# Patient Record
Sex: Male | Born: 1955 | Race: White | Hispanic: No | Marital: Single | Smoking: Former smoker
Health system: Southern US, Community
[De-identification: ages and names within clinical notes are randomized; demographics above are authoritative.]

## PROBLEM LIST (undated history)

## (undated) DIAGNOSIS — I10 Essential (primary) hypertension: Secondary | ICD-10-CM

## (undated) HISTORY — PX: OTHER SURGICAL HISTORY: SHX169

## (undated) HISTORY — PX: KNEE SURGERY: SHX244

## (undated) HISTORY — DX: Essential (primary) hypertension: I10

## (undated) HISTORY — PX: WISDOM TOOTH EXTRACTION: SHX21

---

## 2015-09-18 DIAGNOSIS — I10 Essential (primary) hypertension: Secondary | ICD-10-CM | POA: Insufficient documentation

## 2015-09-18 DIAGNOSIS — R5383 Other fatigue: Secondary | ICD-10-CM | POA: Insufficient documentation

## 2015-09-18 DIAGNOSIS — Z79899 Other long term (current) drug therapy: Secondary | ICD-10-CM | POA: Insufficient documentation

## 2015-09-22 DIAGNOSIS — E782 Mixed hyperlipidemia: Secondary | ICD-10-CM | POA: Insufficient documentation

## 2015-09-22 DIAGNOSIS — R7309 Other abnormal glucose: Secondary | ICD-10-CM | POA: Diagnosis not present

## 2015-09-22 DIAGNOSIS — G5603 Carpal tunnel syndrome, bilateral upper limbs: Secondary | ICD-10-CM

## 2015-09-22 DIAGNOSIS — I10 Essential (primary) hypertension: Secondary | ICD-10-CM | POA: Diagnosis not present

## 2015-09-22 DIAGNOSIS — E1169 Type 2 diabetes mellitus with other specified complication: Secondary | ICD-10-CM | POA: Insufficient documentation

## 2015-09-22 DIAGNOSIS — M159 Polyosteoarthritis, unspecified: Secondary | ICD-10-CM | POA: Insufficient documentation

## 2015-09-22 DIAGNOSIS — R5383 Other fatigue: Secondary | ICD-10-CM | POA: Diagnosis not present

## 2015-09-22 DIAGNOSIS — Z79899 Other long term (current) drug therapy: Secondary | ICD-10-CM | POA: Diagnosis not present

## 2015-09-22 HISTORY — DX: Carpal tunnel syndrome, bilateral upper limbs: G56.03

## 2015-09-22 HISTORY — DX: Mixed hyperlipidemia: E78.2

## 2015-09-25 DIAGNOSIS — E782 Mixed hyperlipidemia: Secondary | ICD-10-CM | POA: Diagnosis not present

## 2015-09-25 DIAGNOSIS — Z79899 Other long term (current) drug therapy: Secondary | ICD-10-CM | POA: Diagnosis not present

## 2015-09-25 DIAGNOSIS — M159 Polyosteoarthritis, unspecified: Secondary | ICD-10-CM | POA: Diagnosis not present

## 2017-03-23 DIAGNOSIS — M25562 Pain in left knee: Secondary | ICD-10-CM | POA: Diagnosis not present

## 2017-03-23 DIAGNOSIS — I10 Essential (primary) hypertension: Secondary | ICD-10-CM | POA: Diagnosis not present

## 2017-03-31 DIAGNOSIS — Z1211 Encounter for screening for malignant neoplasm of colon: Secondary | ICD-10-CM | POA: Diagnosis not present

## 2017-03-31 DIAGNOSIS — Z0000 Encounter for general adult medical examination without abnormal findings: Secondary | ICD-10-CM | POA: Diagnosis not present

## 2017-03-31 DIAGNOSIS — Z6833 Body mass index (BMI) 33.0-33.9, adult: Secondary | ICD-10-CM | POA: Diagnosis not present

## 2017-03-31 DIAGNOSIS — Z125 Encounter for screening for malignant neoplasm of prostate: Secondary | ICD-10-CM | POA: Diagnosis not present

## 2017-05-06 ENCOUNTER — Ambulatory Visit (INDEPENDENT_AMBULATORY_CARE_PROVIDER_SITE_OTHER): Payer: Self-pay

## 2017-05-06 ENCOUNTER — Encounter (INDEPENDENT_AMBULATORY_CARE_PROVIDER_SITE_OTHER): Payer: Self-pay | Admitting: Orthopaedic Surgery

## 2017-05-06 ENCOUNTER — Ambulatory Visit (INDEPENDENT_AMBULATORY_CARE_PROVIDER_SITE_OTHER): Payer: BLUE CROSS/BLUE SHIELD | Admitting: Orthopaedic Surgery

## 2017-05-06 VITALS — BP 179/72 | HR 73 | Resp 14 | Ht 69.0 in | Wt 200.0 lb

## 2017-05-06 DIAGNOSIS — G8929 Other chronic pain: Secondary | ICD-10-CM | POA: Diagnosis not present

## 2017-05-06 DIAGNOSIS — M25562 Pain in left knee: Secondary | ICD-10-CM

## 2017-05-06 MED ORDER — BUPIVACAINE HCL 0.5 % IJ SOLN
2.0000 mL | INTRAMUSCULAR | Status: AC | PRN
  Administered 2017-05-06: 2 mL via INTRA_ARTICULAR

## 2017-05-06 MED ORDER — METHYLPREDNISOLONE ACETATE 40 MG/ML IJ SUSP
80.0000 mg | INTRAMUSCULAR | Status: AC | PRN
  Administered 2017-05-06: 80 mg

## 2017-05-06 MED ORDER — LIDOCAINE HCL 1 % IJ SOLN
2.0000 mL | INTRAMUSCULAR | Status: AC | PRN
  Administered 2017-05-06: 2 mL

## 2017-05-06 NOTE — Progress Notes (Signed)
Office Visit Note   Patient: Maxwell Williams           Date of Birth: February 25, 1956           MRN: [id number] Visit Date: 05/06/2017              Requested by: Gerre Pebbles, PA-C [address] [place], [place] [address] PCP: Blane Ohara, MD   Assessment & Plan: Visit Diagnoses:  1. Chronic pain of left knee     Plan: Insidious onset of left knee pain over the last several months. Suspect problem is related to mild tricompartmental degenerative arthritis. We'll aspirate the knee and injected cortisone. Monitor her response over the next month. We'll try NSAIDs and a pullover knee support  Follow-Up Instructions: Return in about 1 month (around 06/06/2017), or if still symptomatic.   Orders:  Orders Placed This Encounter  Procedures  . Large Joint Inj: L knee  . XR KNEE 3 VIEW LEFT   No orders of the defined types were placed in this encounter.     Procedures: Large Joint Inj: L knee on 05/06/2017 9:14 AM Indications: pain and diagnostic evaluation Details: 25 G 1.5 in needle, anteromedial approach  Arthrogram: No  Medications: 2 mL lidocaine 1 %; 2 mL bupivacaine 0.5 %; 80 mg methylPREDNISolone acetate 40 MG/ML Aspirate: 40 mL clear and yellow Procedure, treatment alternatives, risks and benefits explained, specific risks discussed. Consent was given by the patient. Patient was prepped and draped in the usual sterile fashion.       Clinical Data: No additional findings.   Subjective: Chief Complaint  Patient presents with  . Left Knee - Pain    Maxwell Williams is a 62 y o here today for Left knee pain x 2 months. He was working in a crawl space and twisted it.   Insidious onset of left knee pain over the last several months. It seemed to have initiated while working in a crawl space. No obvious injury or trauma. Has experienced recurrent effusion and stiffness associated with pain along the medial compartment. The pain seems to be slightly worse now than it was a  month ago thus prompting the office visit. No fever or chills. No skin changes. No locking.  HPI  Review of Systems  Constitutional: Negative for fatigue.  HENT: Positive for tinnitus. Negative for hearing loss.   Respiratory: Negative for apnea, chest tightness and shortness of breath.   Cardiovascular: Negative for chest pain, palpitations and leg swelling.  Gastrointestinal: Negative for blood in stool, constipation and diarrhea.  Genitourinary: Negative for difficulty urinating.  Musculoskeletal: Positive for back pain. Negative for arthralgias, joint swelling, myalgias, neck pain and neck stiffness.  Neurological: Positive for weakness. Negative for numbness and headaches.  Hematological: Does not bruise/bleed easily.  Psychiatric/Behavioral: Negative for sleep disturbance. The patient is not nervous/anxious.      Objective: Vital Signs: BP (!) 179/72   Pulse 73   Resp 14   Ht 5\' 9"  (1.753 m)   Wt 200 lb (90.7 kg)   BMI 29.53 kg/m   Physical Exam  Ortho Exam awake alert and oriented 3. Comfortable sitting. Fairly large effusion left knee. Predominantly medial joint pain that was mild to moderate. No lateral joint pain. No patella crepitation. Full extension. Flexed over 110 without instability. No popliteal pain. No calf pain. No distal edema. Neurovascular exam intact. Straight leg raise negative. Painless range of motion both hips with internal/external rotation  Specialty Comments:  No specialty comments available.  Imaging: Xr Knee 3 View Left  Result Date: 05/06/2017 Films of the left knee are obtained in several projections standing. There are mild tricompartmental degenerative changes. Predominantly noted changes and at the patellofemoral joint where there is just minimal lateral patella tilt and peripheral osteophyte formation and some narrowing of the medial joint space. No ectopic calcification. About 1 of varus. Findings are consistent with mild to moderate  osteoarthritis    PMFS History: There are no active problems to display for this patient.  Past Medical History:  Diagnosis Date  . Hypertension     Family History  Problem Relation Age of Onset  . COPD Mother     Past Surgical History:  Procedure Laterality Date  . WISDOM TOOTH EXTRACTION    . wisom     Social History   Occupational History  . Not on file  Tobacco Use  . Smoking status: Former Games developermoker  . Smokeless tobacco: Former Engineer, waterUser  Substance and Sexual Activity  . Alcohol use: No    Frequency: Never  . Drug use: No  . Sexual activity: Not on file

## 2017-05-23 ENCOUNTER — Telehealth (INDEPENDENT_AMBULATORY_CARE_PROVIDER_SITE_OTHER): Payer: Self-pay | Admitting: Orthopaedic Surgery

## 2017-05-23 ENCOUNTER — Other Ambulatory Visit (INDEPENDENT_AMBULATORY_CARE_PROVIDER_SITE_OTHER): Payer: Self-pay | Admitting: Radiology

## 2017-05-23 DIAGNOSIS — M25562 Pain in left knee: Secondary | ICD-10-CM

## 2017-05-23 NOTE — Telephone Encounter (Signed)
Please advise 

## 2017-05-23 NOTE — Telephone Encounter (Signed)
[facility]

## 2017-05-23 NOTE — Progress Notes (Signed)
[facility]

## 2017-05-23 NOTE — Telephone Encounter (Signed)
Agree-MRI left knee

## 2017-05-23 NOTE — Telephone Encounter (Signed)
Patient called stating that he had a cortisone shot and fluid removed from his left knee at his office visit on 3/1 which gave him relief for approximately 3-4 days.  The pain has returned and he is requesting a referral to get an MRI.

## 2017-06-03 ENCOUNTER — Ambulatory Visit
Admission: RE | Admit: 2017-06-03 | Discharge: 2017-06-03 | Disposition: A | Discharge status: Home / Self Care | Payer: BLUE CROSS/BLUE SHIELD | Source: Ambulatory Visit | Attending: Orthopaedic Surgery | Admitting: Orthopaedic Surgery

## 2017-06-03 ENCOUNTER — Other Ambulatory Visit: Payer: Self-pay

## 2017-06-03 DIAGNOSIS — M25562 Pain in left knee: Secondary | ICD-10-CM | POA: Diagnosis not present

## 2017-06-07 ENCOUNTER — Ambulatory Visit (INDEPENDENT_AMBULATORY_CARE_PROVIDER_SITE_OTHER): Payer: BLUE CROSS/BLUE SHIELD | Admitting: Orthopaedic Surgery

## 2017-06-10 ENCOUNTER — Ambulatory Visit (INDEPENDENT_AMBULATORY_CARE_PROVIDER_SITE_OTHER): Payer: BLUE CROSS/BLUE SHIELD | Admitting: Orthopaedic Surgery

## 2017-06-10 ENCOUNTER — Encounter (INDEPENDENT_AMBULATORY_CARE_PROVIDER_SITE_OTHER): Payer: Self-pay | Admitting: Orthopaedic Surgery

## 2017-06-10 VITALS — BP 160/74 | HR 72 | Resp 16 | Ht 69.5 in | Wt 212.0 lb

## 2017-06-10 DIAGNOSIS — M25562 Pain in left knee: Secondary | ICD-10-CM | POA: Diagnosis not present

## 2017-06-10 DIAGNOSIS — G8929 Other chronic pain: Secondary | ICD-10-CM

## 2017-06-10 NOTE — Progress Notes (Signed)
Office Visit Note   Patient: Maxwell CahillMark A Digirolamo           Date of Birth: 10/07/1955           MRN: [id number] Visit Date: 06/10/2017              Requested by: Blane Oharaox, Kirsten, MD [address] [place], [place] [id number] PCP: Blane Oharaox, Kirsten, MD   Assessment & Plan: Visit Diagnoses:  1. 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  Also has tears of both medial lateral menisci.  Long discussion regarding the MRI scan findings.  I would suggest arthroscopy.  This would be to address mechanical issues related to the arthritis and both meniscal tears.  I discussed the time out of work and the potential for pain relief.  Discussed potential complications.  Works up to 8 or 9 hours a day on his feet and it might be weeks before he can return to that activity.  He certainly could have some residual pain because of his arthritis.  He is fully aware that.  [place] he certainly has notations in terms of the arthritis which I have discussed to proceed .we will set this up at his nodule.  Follow-Up Instructions: Return will schedule surgery.   Orders:  No orders of the defined types were placed in this encounter.  No orders of the defined types were placed in this encounter.     Procedures: No procedures performed   Clinical Data: No additional findings.   Subjective: Chief Complaint  Patient presents with  . Follow-up    pain and numbness down right leg, review MRI  Initial onset of pain December 2018 in his crawl space fixing some duct work.  Today at work on his feet and has had a lot of difficulty with pain aching swelling as well as stiffness.  X-rays have revealed some degenerative changes in his knees.  I ordered an MRI scan which demonstrates a tear of the posterior horn of the medial lateral menisci near the roots.  He also has varying degrees of  arthritis in all 3 compartments with some areas of full-thickness cartilage loss.  HPI  Review of Systems  Constitutional: Negative for fatigue.  HENT: Negative for ear pain.   Eyes: Negative for pain.  Respiratory: Negative for cough and shortness of breath.   Gastrointestinal: Negative for constipation and diarrhea.  Genitourinary: Negative for difficulty urinating.  Musculoskeletal: Positive for back pain. Negative for neck pain.  Skin: Negative for rash.  Allergic/Immunologic: Negative for food allergies.  Neurological: Positive for weakness. Negative for dizziness, numbness and headaches.  Hematological: Does not bruise/bleed easily.  Psychiatric/Behavioral: Negative for sleep disturbance.     Objective: Vital Signs: BP (!) 160/74 (BP Location: Left Arm, Patient Position: Sitting, Cuff Size: Normal)   Pulse 72   Resp 16   Ht 5' 9.5" (1.765 m)   Wt 212 lb (96.2 kg)   BMI 30.86 kg/m   Physical Exam  Ortho Exam awake alert and oriented x3.  Comfortable sitting.  Small effusion left knee.  Does have medial lateral joint pain.  Some lack of full extension which is probably the small effusion.  Flexed over 100 degrees.  No instability.  No calf pain.  No distal edema.  No thigh or hip pain.  Neurovascular exam intact.  Specialty Comments:  No  specialty comments available.  Imaging: No results found.   PMFS History: There are no active problems to display for this patient.  Past Medical History:  Diagnosis Date  . Hypertension     Family History  Problem Relation Age of Onset  . COPD Mother     Past Surgical History:  Procedure Laterality Date  . WISDOM TOOTH EXTRACTION    . wisom     Social History   Occupational History  . Not on file  Tobacco Use  . Smoking status: Former Games developer  . Smokeless tobacco: Former Engineer, water and Sexual Activity  . Alcohol use: No    Frequency: Never  . Drug use: No  . Sexual activity: Not on file

## 2017-06-14 ENCOUNTER — Inpatient Hospital Stay (INDEPENDENT_AMBULATORY_CARE_PROVIDER_SITE_OTHER): Payer: BLUE CROSS/BLUE SHIELD | Admitting: Orthopaedic Surgery

## 2017-06-16 DIAGNOSIS — M659 Synovitis and tenosynovitis, unspecified: Secondary | ICD-10-CM | POA: Diagnosis not present

## 2017-06-16 DIAGNOSIS — M23252 Derangement of posterior horn of lateral meniscus due to old tear or injury, left knee: Secondary | ICD-10-CM | POA: Diagnosis not present

## 2017-06-16 DIAGNOSIS — M94262 Chondromalacia, left knee: Secondary | ICD-10-CM | POA: Diagnosis not present

## 2017-06-16 DIAGNOSIS — M1712 Unilateral primary osteoarthritis, left knee: Secondary | ICD-10-CM | POA: Diagnosis not present

## 2017-06-16 DIAGNOSIS — G8918 Other acute postprocedural pain: Secondary | ICD-10-CM | POA: Diagnosis not present

## 2017-06-16 DIAGNOSIS — S83281A Other tear of lateral meniscus, current injury, right knee, initial encounter: Secondary | ICD-10-CM | POA: Diagnosis not present

## 2017-06-16 DIAGNOSIS — M23222 Derangement of posterior horn of medial meniscus due to old tear or injury, left knee: Secondary | ICD-10-CM | POA: Diagnosis not present

## 2017-06-16 DIAGNOSIS — S83241A Other tear of medial meniscus, current injury, right knee, initial encounter: Secondary | ICD-10-CM | POA: Diagnosis not present

## 2017-06-20 ENCOUNTER — Encounter (INDEPENDENT_AMBULATORY_CARE_PROVIDER_SITE_OTHER): Payer: Self-pay | Admitting: Orthopaedic Surgery

## 2017-06-20 ENCOUNTER — Ambulatory Visit (INDEPENDENT_AMBULATORY_CARE_PROVIDER_SITE_OTHER): Payer: BLUE CROSS/BLUE SHIELD | Admitting: Orthopaedic Surgery

## 2017-06-20 VITALS — BP 147/131 | HR 80 | Resp 16 | Ht 69.0 in | Wt 215.0 lb

## 2017-06-20 DIAGNOSIS — M25562 Pain in left knee: Secondary | ICD-10-CM

## 2017-06-20 DIAGNOSIS — G8929 Other chronic pain: Secondary | ICD-10-CM

## 2017-06-20 NOTE — Progress Notes (Signed)
Office Visit Note   Patient: Maxwell CahillMark A Williams           Date of Birth: 07/18/1955           MRN: [id number] Visit Date: 06/20/2017              Requested by: Blane Oharaox, Kirsten, MD [address] [place], [place] [id number] PCP: Blane Oharaox, Kirsten, MD   Assessment & Plan: Visit Diagnoses:  1. Chronic pain of left knee     Plan: 4 days status post left knee arthroscopy.  Had partial medial and lateral meniscectomy.  Also had grade II and III chondromalacia in all 3 compartments.  Doing well.  No longer using cane or crutch.  Takes oxycodone 2-3 times a day.  Would like to return to work on Thursday comfortable.  We will give him a note to that effect and see him in 7-10 days  Follow-Up Instructions: Return in about 1 week (around 06/27/2017).   Orders:  No orders of the defined types were placed in this encounter.  No orders of the defined types were placed in this encounter.     Procedures: No procedures performed   Clinical Data: No additional findings.   Subjective: Chief Complaint  Patient presents with  . Left Knee - Routine Post Op  . Post-op Follow-up    l knee f/u post op 06/16/17, no issues  4 days status post left knee arthroscopy.  Had tears of medial lateral menisci with partial meniscectomies.  Also had chondromalacia in all 3 compartments.  Doing well.  No fever or chills.  No longer using ambulatory aid.  Takes an occasional oxycodone.  HPI  Review of Systems  Constitutional: Negative for fatigue and fever.  HENT: Negative for ear pain.   Eyes: Negative for pain.  Respiratory: Negative for cough and shortness of breath.   Cardiovascular: Negative for leg swelling.  Gastrointestinal: Negative for constipation and diarrhea.  Genitourinary: Negative for difficulty urinating.  Musculoskeletal: Negative for back pain and neck pain.  Skin: Negative for rash.  Allergic/Immunologic: Negative for food allergies.  Neurological: Positive for weakness. Negative for  numbness.  Hematological: Does not bruise/bleed easily.  Psychiatric/Behavioral: Negative for sleep disturbance.     Objective: Vital Signs: BP (!) 147/131 (BP Location: Left Arm, Patient Position: Sitting, Cuff Size: Normal)   Pulse 80   Resp 16   Ht 5\' 9"  (1.753 m)   Wt 215 lb (97.5 kg)   BMI 31.75 kg/m   Physical Exam  Ortho Exam Awake alert and oriented x3.  Comfortable sitting.  Lacks a few degrees to full extension left knee but has a small effusion.  Flexed at least 105 degrees.  Arthroscopic portals clean.  No drainage.  No calf pain.  Neurovascular exam intact Specialty Comments:  No specialty comments available.  Imaging: No results found.   PMFS History: There are no active problems to display for this patient.  Past Medical History:  Diagnosis Date  . Hypertension     Family History  Problem Relation Age of Onset  . COPD Mother     Past Surgical History:  Procedure Laterality Date  . KNEE SURGERY    . WISDOM TOOTH EXTRACTION    . wisom     Social History   Occupational History  . Not on file  Tobacco Use  . Smoking status: Former Games developermoker  . Smokeless tobacco: Former Engineer, waterUser  Substance and Sexual Activity  . Alcohol use: No  Frequency: Never  . Drug use: No  . Sexual activity: Not on file

## 2017-07-04 ENCOUNTER — Ambulatory Visit (INDEPENDENT_AMBULATORY_CARE_PROVIDER_SITE_OTHER): Payer: BLUE CROSS/BLUE SHIELD | Admitting: Orthopaedic Surgery

## 2017-07-05 ENCOUNTER — Encounter (INDEPENDENT_AMBULATORY_CARE_PROVIDER_SITE_OTHER): Payer: Self-pay | Admitting: Orthopaedic Surgery

## 2017-07-05 ENCOUNTER — Ambulatory Visit (INDEPENDENT_AMBULATORY_CARE_PROVIDER_SITE_OTHER): Payer: BLUE CROSS/BLUE SHIELD | Admitting: Orthopaedic Surgery

## 2017-07-05 VITALS — BP 127/70 | HR 80 | Resp 16 | Ht 69.5 in | Wt 210.0 lb

## 2017-07-05 DIAGNOSIS — M25562 Pain in left knee: Secondary | ICD-10-CM

## 2017-07-05 DIAGNOSIS — G8929 Other chronic pain: Secondary | ICD-10-CM

## 2017-07-05 MED ORDER — METHYLPREDNISOLONE ACETATE 40 MG/ML IJ SUSP
80.0000 mg | INTRAMUSCULAR | Status: AC | PRN
  Administered 2017-07-05: 80 mg

## 2017-07-05 MED ORDER — LIDOCAINE HCL 1 % IJ SOLN
2.0000 mL | INTRAMUSCULAR | Status: AC | PRN
  Administered 2017-07-05: 2 mL

## 2017-07-05 MED ORDER — BUPIVACAINE HCL 0.5 % IJ SOLN
2.0000 mL | INTRAMUSCULAR | Status: AC | PRN
  Administered 2017-07-05: 2 mL via INTRA_ARTICULAR

## 2017-07-05 NOTE — Progress Notes (Signed)
Office Visit Note   Patient: Maxwell Williams           Date of Birth: 02-Feb-1956           MRN: [id number] Visit Date: 07/05/2017              Requested by: Blane Ohara, MD [address] [place], [place] [address] PCP: Blane Ohara, MD   Assessment & Plan: Visit Diagnoses:  1. Chronic pain of left knee     Plan: Nearly 3 weeks status post left knee arthroscopy.  There were diffuse grade II and III chondromalacia changes as well as a tear of the medial lateral menisci.  A partial medial lateral meniscus ectomy's were performed.  He is back to work.  Having some recurrent effusion.  I aspirated his knee today of 90 cc of clear yellow fluid injected cortisone.  Felt much better.  Have talked about limiting activity and taking NSAIDs.  Return in 2 weeks  Follow-Up Instructions: Return in about 2 weeks (around 07/19/2017).   Orders:  Orders Placed This Encounter  Procedures  . Large Joint Inj: L knee   No orders of the defined types were placed in this encounter.     Procedures: Large Joint Inj: L knee on 07/05/2017 4:02 PM Indications: pain and diagnostic evaluation Details: 25 G 1.5 in needle, anteromedial approach  Arthrogram: No  Medications: 2 mL lidocaine 1 %; 2 mL bupivacaine 0.5 %; 80 mg methylPREDNISolone acetate 40 MG/ML Aspirate: 90 mL clear and yellow Outcome: tolerated well, no immediate complications Procedure, treatment alternatives, risks and benefits explained, specific risks discussed. Consent was given by the patient. Patient was prepped and draped in the usual sterile fashion.       Clinical Data: No additional findings.   Subjective: Chief Complaint  Patient presents with  . Left Knee - Follow-up  . Follow-up    L KNEE F/U PAIN WAKING FROM SLEEP, CAUSING MUSCLE PAIN ON RIGHT SIDE KNEE AND CALF MUSCLES   Experiencing left knee swelling and stiffness.  No fever chills  HPI  Review of Systems  Constitutional: Negative for fatigue and  fever.  HENT: Negative for ear pain.   Eyes: Negative for pain.  Respiratory: Negative for cough and shortness of breath.   Cardiovascular: Positive for leg swelling.  Gastrointestinal: Negative for constipation and diarrhea.  Genitourinary: Negative for difficulty urinating.  Musculoskeletal: Positive for back pain. Negative for neck pain.  Skin: Negative for rash.  Allergic/Immunologic: Negative for food allergies.  Neurological: Positive for weakness. Negative for numbness.  Hematological: Does not bruise/bleed easily.  Psychiatric/Behavioral: Positive for sleep disturbance.     Objective: Vital Signs: BP 127/70 (BP Location: Left Arm, Patient Position: Sitting, Cuff Size: Normal)   Pulse 80   Resp 16   Ht 5' 9.5" (1.765 m)   Wt 210 lb (95.3 kg)   BMI 30.57 kg/m   Physical Exam  Ortho Exam awake alert and oriented x3.  Comfortable sitting.  Left knee with positive effusion.  Minimally increased heat.  No significant tenderness.  No calf pain.  Swelling distally.  Full extension to about 115 degrees of flexion without instability. After aspiration had full extension no pain in over 125 degrees of flexion  Specialty Comments:  No specialty comments available.  Imaging: No results found.   PMFS History: There are no active problems to display for this patient.  Past Medical History:  Diagnosis Date  . Hypertension  Family History  Problem Relation Age of Onset  . COPD Mother     Past Surgical History:  Procedure Laterality Date  . KNEE SURGERY    . WISDOM TOOTH EXTRACTION    . wisom     Social History   Occupational History  . Not on file  Tobacco Use  . Smoking status: Former Games developer  . Smokeless tobacco: Former Engineer, water and Sexual Activity  . Alcohol use: No    Frequency: Never  . Drug use: No  . Sexual activity: Not on file

## 2017-07-07 ENCOUNTER — Ambulatory Visit (INDEPENDENT_AMBULATORY_CARE_PROVIDER_SITE_OTHER): Payer: BLUE CROSS/BLUE SHIELD | Admitting: Orthopaedic Surgery

## 2017-07-18 ENCOUNTER — Ambulatory Visit (INDEPENDENT_AMBULATORY_CARE_PROVIDER_SITE_OTHER): Payer: BLUE CROSS/BLUE SHIELD | Admitting: Orthopaedic Surgery

## 2017-07-25 ENCOUNTER — Ambulatory Visit (INDEPENDENT_AMBULATORY_CARE_PROVIDER_SITE_OTHER): Payer: BLUE CROSS/BLUE SHIELD | Admitting: Orthopaedic Surgery

## 2017-07-25 ENCOUNTER — Encounter (INDEPENDENT_AMBULATORY_CARE_PROVIDER_SITE_OTHER): Payer: Self-pay | Admitting: Orthopaedic Surgery

## 2017-07-25 VITALS — BP 150/80 | HR 80 | Resp 18 | Ht 69.0 in | Wt 216.0 lb

## 2017-07-25 DIAGNOSIS — M25562 Pain in left knee: Secondary | ICD-10-CM

## 2017-07-25 DIAGNOSIS — G8929 Other chronic pain: Secondary | ICD-10-CM

## 2017-07-25 NOTE — Progress Notes (Signed)
Office Visit Note   Patient: Maxwell Williams           Date of Birth: 1955/12/04           MRN: [id number] Visit Date: 07/25/2017              Requested by: Blane Ohara, MD [address] [place], [place] [address] PCP: Blane Ohara, MD   Assessment & Plan: Visit Diagnoses:  1. Chronic pain of left knee     Plan: 6 weeks status post left knee arthroscopy.  I performed a partial medial meniscectomy.  There were grade 2 and 3 areas of chondromalacia diffusely about the knee.  Maxwell Williams is been back to work and has noted improvement in his pain.  He has had some recurrent effusion.  We will start an exercise program, continue NSAIDs and see me in 6 weeks  Follow-Up Instructions: Return in about 6 weeks (around 09/05/2017).   Orders:  No orders of the defined types were placed in this encounter.  No orders of the defined types were placed in this encounter.     Procedures: No procedures performed   Clinical Data: No additional findings.   Subjective: Chief Complaint  Patient presents with  . Left Knee - Pain  . Post-op Follow-up    06/17/17 LKA  6 weeks status post left knee arthroscopy.  I saw Maxwell Williams 3 weeks ago and aspirated his knee and injected cortisone.  He is a little bit better.  He is having less pain in his knee.  He is on his feet over the course of his workday.  He has some swelling at the end of the day but it is less it was in the past.  No numbness or tingling.  Has been taking NSAIDs which help. HPI  Review of Systems  Constitutional: Negative for fatigue and fever.  HENT: Negative for ear pain.   Eyes: Negative for pain.  Respiratory: Negative for cough and shortness of breath.   Cardiovascular: Positive for leg swelling.  Gastrointestinal: Negative for constipation and diarrhea.  Genitourinary: Negative for difficulty urinating.  Musculoskeletal: Positive for back pain. Negative for neck pain.  Skin: Negative for rash.  Allergic/Immunologic:  Negative for food allergies.  Neurological: Positive for weakness and numbness.  Hematological: Does not bruise/bleed easily.  Psychiatric/Behavioral: Positive for sleep disturbance.     Objective: Vital Signs: BP (!) 150/80 (BP Location: Left Arm, Patient Position: Sitting, Cuff Size: Normal)   Pulse 80   Resp 18   Ht  (1.753 m)   Wt 216 lb (98 kg)   BMI 31.90 kg/m   Physical Exam  Ortho Exam awake alert and oriented x3.  Comfortable sitting.  Mild effusion left knee.  He can flex at least 110 degrees with full extension.  No instability.  No significant tenderness medially laterally or beneath the patella.  No swelling distally.  Neurovascular exam intact  Specialty Comments:  No specialty comments available.  Imaging: No results found.   PMFS History: There are no active problems to display for this patient.  Past Medical History:  Diagnosis Date  . Hypertension     Family History  Problem Relation Age of Onset  . COPD Mother     Past Surgical History:  Procedure Laterality Date  . KNEE SURGERY    . WISDOM TOOTH EXTRACTION    . wisom     Social History   Occupational History  . Not on file  Tobacco Use  . Smoking status: Former Games developer  . Smokeless tobacco: Former Engineer, water and Sexual Activity  . Alcohol use: No    Frequency: Never  . Drug use: No  . Sexual activity: Not on file

## 2017-09-05 ENCOUNTER — Encounter (INDEPENDENT_AMBULATORY_CARE_PROVIDER_SITE_OTHER): Payer: Self-pay | Admitting: Orthopaedic Surgery

## 2017-09-05 ENCOUNTER — Ambulatory Visit (INDEPENDENT_AMBULATORY_CARE_PROVIDER_SITE_OTHER): Payer: BLUE CROSS/BLUE SHIELD | Admitting: Orthopaedic Surgery

## 2017-09-05 VITALS — BP 173/92 | HR 69 | Ht 69.5 in | Wt 216.0 lb

## 2017-09-05 DIAGNOSIS — M25562 Pain in left knee: Secondary | ICD-10-CM

## 2017-09-05 MED ORDER — BUPIVACAINE HCL 0.5 % IJ SOLN
2.0000 mL | INTRAMUSCULAR | Status: AC | PRN
  Administered 2017-09-05: 2 mL via INTRA_ARTICULAR

## 2017-09-05 MED ORDER — METHYLPREDNISOLONE ACETATE 40 MG/ML IJ SUSP
80.0000 mg | INTRAMUSCULAR | Status: AC | PRN
  Administered 2017-09-05: 80 mg

## 2017-09-05 MED ORDER — LIDOCAINE HCL 1 % IJ SOLN
2.0000 mL | INTRAMUSCULAR | Status: AC | PRN
  Administered 2017-09-05: 2 mL

## 2017-09-05 NOTE — Progress Notes (Signed)
Office Visit Note   Patient: Maxwell CahillMark A Hermann           Date of Birth: 08/12/1955           MRN: [id number] Visit Date: 09/05/2017              Requested by: Blane Oharaox, Kirsten, MD [address] [place], [place] [id number] PCP: Blane Oharaox, Kirsten, MD   Assessment & Plan: Visit Diagnoses:  1. Acute pain of left knee     Plan: Recurrent effusion left knee nearly 3 months post left knee arthroscopy.  Had a tear of the medial lateral menisci as well as diffuse degenerative changes.  Will re-aspirate the knee and inject with cortisone.  Long discussion regarding status of knee and review of operative note  Follow-Up Instructions: Return if symptoms worsen or fail to improve.   Orders:  Orders Placed This Encounter  Procedures  . Large Joint Inj: L knee  . Anaerobic and Aerobic Culture  . Synovial cell count + diff, w/ crystals   No orders of the defined types were placed in this encounter.     Procedures: Large Joint Inj: L knee on 09/05/2017 5:04 PM Indications: pain and diagnostic evaluation Details: 25 G 1.5 in needle, anteromedial approach  Arthrogram: No  Medications: 2 mL lidocaine 1 %; 2 mL bupivacaine 0.5 %; 80 mg methylPREDNISolone acetate 40 MG/ML Aspirate: 63 mL clear and yellow Procedure, treatment alternatives, risks and benefits explained, specific risks discussed. Consent was given by the patient. Patient was prepped and draped in the usual sterile fashion.       Clinical Data: No additional findings.   Subjective: Chief Complaint  Patient presents with  . Follow-up    L KNEE PAIN SURGERY 06/16/17 L TKA F/U AND DRAINED IT 2 TIMES STILL HAVING SWELLING. ALSO HAVING LOWER MUSCLE LEG  PAIN FROM FAVORING LEFT KNEE  Nearly 3 months status post left knee arthroscopy.  Included debridement of the tear of the posterior horn of the medial meniscus and tear near the root of the lateral meniscus.  There were areas of grade II and III chondromalacia in all 3  compartments.  Has had recurrent effusion without fever or chills.  Spends at least 9 hours a day on his feet  HPI  Review of Systems  Constitutional: Negative for fatigue and fever.  HENT: Negative for ear pain.   Eyes: Negative for pain.  Respiratory: Negative for cough and shortness of breath.   Cardiovascular: Positive for leg swelling.  Gastrointestinal: Negative for constipation and diarrhea.  Genitourinary: Negative for difficulty urinating.  Musculoskeletal: Positive for back pain. Negative for neck pain.  Skin: Positive for rash.  Allergic/Immunologic: Negative for food allergies.  Neurological: Positive for weakness and numbness.  Hematological: Does not bruise/bleed easily.  Psychiatric/Behavioral: Positive for sleep disturbance.     Objective: Vital Signs: BP (!) 173/92 (BP Location: Left Arm, Patient Position: Sitting, Cuff Size: Normal)   Pulse 69   Ht 5' 9.5" (1.765 m)   Wt 216 lb (98 kg)   BMI 31.44 kg/m   Physical Exam  Ortho Exam awake alert and oriented x3.  Comfortable sitting.  Large effusion left knee with minimal warmth.  No significant pain.  Full extension flexion about 100 degrees without instability.  Some mild patellar crepitation.  After knee aspiration much better flexion extension with little if any pain.  Specialty Comments:  No specialty comments available.  Imaging: No results found.   PMFS History:  There are no active problems to display for this patient.  Past Medical History:  Diagnosis Date  . Hypertension     Family History  Problem Relation Age of Onset  . COPD Mother     Past Surgical History:  Procedure Laterality Date  . KNEE SURGERY    . WISDOM TOOTH EXTRACTION    . wisom     Social History   Occupational History  . Not on file  Tobacco Use  . Smoking status: Former Games developer  . Smokeless tobacco: Former Engineer, water and Sexual Activity  . Alcohol use: No    Frequency: Never  . Drug use: No  . Sexual  activity: Not on file

## 2017-09-11 LAB — ANAEROBIC AND AEROBIC CULTURE
AER RESULT:: NO GROWTH
MICRO NUMBER: 90784684
MICRO NUMBER:: 90784587
SPECIMEN QUALITY:: ADEQUATE
SPECIMEN QUALITY:: ADEQUATE

## 2017-09-11 LAB — SYNOVIAL CELL COUNT + DIFF, W/ CRYSTALS
BASOPHILS, %: 0 %
Eosinophils-Synovial: 0 % (ref 0–2)
LYMPHOCYTES-SYNOVIAL FLD: 52 % (ref 0–74)
Monocyte/Macrophage: 40 % (ref 0–69)
NEUTROPHIL, SYNOVIAL: 3 % (ref 0–24)
Synoviocytes, %: 5 % (ref 0–15)
WBC, Synovial: 268 cells/uL — ABNORMAL HIGH (ref <150)

## 2017-09-28 DIAGNOSIS — R7301 Impaired fasting glucose: Secondary | ICD-10-CM | POA: Diagnosis not present

## 2017-09-28 DIAGNOSIS — R799 Abnormal finding of blood chemistry, unspecified: Secondary | ICD-10-CM | POA: Diagnosis not present

## 2017-09-28 DIAGNOSIS — I10 Essential (primary) hypertension: Secondary | ICD-10-CM | POA: Diagnosis not present

## 2017-09-28 DIAGNOSIS — E782 Mixed hyperlipidemia: Secondary | ICD-10-CM | POA: Diagnosis not present

## 2017-10-12 DIAGNOSIS — Z1212 Encounter for screening for malignant neoplasm of rectum: Secondary | ICD-10-CM | POA: Diagnosis not present

## 2017-10-12 DIAGNOSIS — Z1211 Encounter for screening for malignant neoplasm of colon: Secondary | ICD-10-CM | POA: Diagnosis not present

## 2017-10-12 LAB — COLOGUARD: Cologuard: NEGATIVE

## 2018-04-03 DIAGNOSIS — Z0000 Encounter for general adult medical examination without abnormal findings: Secondary | ICD-10-CM | POA: Diagnosis not present

## 2018-04-03 DIAGNOSIS — Z6833 Body mass index (BMI) 33.0-33.9, adult: Secondary | ICD-10-CM | POA: Diagnosis not present

## 2018-10-02 DIAGNOSIS — L255 Unspecified contact dermatitis due to plants, except food: Secondary | ICD-10-CM | POA: Diagnosis not present

## 2019-03-14 DIAGNOSIS — E782 Mixed hyperlipidemia: Secondary | ICD-10-CM | POA: Diagnosis not present

## 2019-03-14 DIAGNOSIS — R7303 Prediabetes: Secondary | ICD-10-CM | POA: Diagnosis not present

## 2019-03-14 DIAGNOSIS — I10 Essential (primary) hypertension: Secondary | ICD-10-CM | POA: Diagnosis not present

## 2019-03-14 DIAGNOSIS — R7301 Impaired fasting glucose: Secondary | ICD-10-CM | POA: Diagnosis not present

## 2019-03-14 DIAGNOSIS — F411 Generalized anxiety disorder: Secondary | ICD-10-CM | POA: Diagnosis not present

## 2019-03-22 DIAGNOSIS — Z03818 Encounter for observation for suspected exposure to other biological agents ruled out: Secondary | ICD-10-CM | POA: Diagnosis not present

## 2019-04-04 DIAGNOSIS — F411 Generalized anxiety disorder: Secondary | ICD-10-CM | POA: Diagnosis not present

## 2019-04-04 DIAGNOSIS — I10 Essential (primary) hypertension: Secondary | ICD-10-CM | POA: Diagnosis not present

## 2019-04-23 ENCOUNTER — Other Ambulatory Visit: Payer: Self-pay | Admitting: Physician Assistant

## 2019-04-23 MED ORDER — LISINOPRIL 40 MG PO TABS: 40.0000 mg | ORAL_TABLET | Freq: Every day | ORAL | 1 refills | Status: DC

## 2019-05-14 ENCOUNTER — Encounter: Payer: Self-pay | Admitting: Physician Assistant

## 2019-05-14 ENCOUNTER — Other Ambulatory Visit: Payer: Self-pay | Admitting: Physician Assistant

## 2019-05-14 ENCOUNTER — Other Ambulatory Visit: Payer: Self-pay

## 2019-05-14 ENCOUNTER — Ambulatory Visit: Payer: BC Managed Care – PPO | Admitting: Physician Assistant

## 2019-05-14 VITALS — BP 160/84 | HR 83 | Temp 97.3°F | Resp 16 | Wt 236.0 lb

## 2019-05-14 DIAGNOSIS — L247 Irritant contact dermatitis due to plants, except food: Secondary | ICD-10-CM | POA: Diagnosis not present

## 2019-05-14 MED ORDER — DICLOFENAC SODIUM 1 % EX GEL: CUTANEOUS | 2 refills | Status: AC

## 2019-05-14 MED ORDER — TRIAMCINOLONE ACETONIDE 40 MG/ML IJ SUSP
60.0000 mg | Freq: Once | INTRAMUSCULAR | Status: AC
  Administered 2019-05-14: 60 mg via INTRAMUSCULAR

## 2019-05-14 NOTE — Assessment & Plan Note (Signed)
Kenalog 60mg  given IM May take otc claritin qd

## 2019-05-14 NOTE — Progress Notes (Signed)
Acute Office Visit  Subjective:    Patient ID: Maxwell Williams, male    DOB: [birth year], 64 y.o.   MRN: [id number]  Chief Complaint  Patient presents with  . Rash    HPI Patient is in today for poison oak - he has been cleaning brush and got rash on arms and upper legs - happened about a week ago - states it is pruritic Would like to get shot of kenalog  Pt with history of hypertension - did note bp elevated today - he attributes to rushing around and hurrying - he has taken medication today - denies chest pain/sob/edema  Past Medical History:  Diagnosis Date  . Hypertension     Past Surgical History:  Procedure Laterality Date  . KNEE SURGERY    . WISDOM TOOTH EXTRACTION    . wisom      Family History  Problem Relation Age of Onset  . COPD Mother     Social History   Socioeconomic History  . Marital status: Single    Spouse name: Not on file  . Number of children: Not on file  . Years of education: Not on file  . Highest education level: Not on file  Occupational History  . Not on file  Tobacco Use  . Smoking status: Former Research scientist (life sciences)  . Smokeless tobacco: Former Network engineer and Sexual Activity  . Alcohol use: No  . Drug use: No  . Sexual activity: Not on file  Other Topics Concern  . Not on file  Social History Narrative  . Not on file   Social Determinants of Health   Financial Resource Strain:   . Difficulty of Paying Living Expenses: Not on file  Food Insecurity:   . Worried About Charity fundraiser in the Last Year: Not on file  . Ran Out of Food in the Last Year: Not on file  Transportation Needs:   . Lack of Transportation (Medical): Not on file  . Lack of Transportation (Non-Medical): Not on file  Physical Activity:   . Days of Exercise per Week: Not on file  . Minutes of Exercise per Session: Not on file  Stress:   . Feeling of Stress : Not on file  Social Connections:   . Frequency of Communication with Friends and Family: Not on  file  . Frequency of Social Gatherings with Friends and Family: Not on file  . Attends Religious Services: Not on file  . Active Member of Clubs or Organizations: Not on file  . Attends Archivist Meetings: Not on file  . Marital Status: Not on file  Intimate Partner Violence:   . Fear of Current or Ex-Partner: Not on file  . Emotionally Abused: Not on file  . Physically Abused: Not on file  . Sexually Abused: Not on file     Current Outpatient Medications:  .  diclofenac Sodium (VOLTAREN) 1 % GEL, Apply to affected areas bid as needed, Disp: , Rfl:  .  atorvastatin (LIPITOR) 10 MG tablet, Take 10 mg by mouth daily., Disp: , Rfl:  .  busPIRone (BUSPAR) 10 MG tablet, Take 10 mg by mouth 2 (two) times daily., Disp: , Rfl:  .  lisinopril (ZESTRIL) 40 MG tablet, Take 1 tablet (40 mg total) by mouth daily., Disp: 90 tablet, Rfl: 1  Current Facility-Administered Medications:  .  triamcinolone acetonide (KENALOG-40) injection 60 mg, 60 mg, Intramuscular, Once, Marge Duncans, PA-C   No Known Allergies  CONSTITUTIONAL: Negative  for chills, fatigue, fever, unintentional weight gain and unintentional weight loss.   CARDIOVASCULAR: Negative for chest pain, dizziness, palpitations and pedal edema.  RESPIRATORY: Negative for recent cough and dyspnea.   MSK: Negative for arthralgias and myalgias.  INTEGUMENTARY: see HPI NEUROLOGICAL: Negative for dizziness and headaches.  PSYCHIATRIC: Negative for sleep disturbance and to question depression screen.  Negative for depression, negative for anhedonia.         Objective:    PHYSICAL EXAM:   VS: BP (!) 160/84   Pulse 83   Temp (!) 97.3 F (36.3 C)   Resp 16   Wt 236 lb (107 kg)   SpO2 98%   BMI 34.35 kg/m   GEN: Well nourished, well developed, in no acute distress   Cardiac: RRR; no murmurs, rubs, or gallops,no edema - no significant varicosities Respiratory:  normal respiratory rate and pattern with no distress - normal  breath sounds with no rales, rhonchi, wheezes or rubs  Skin: warm and dry, linear vesicular lesions on arms/wrists Neuro:  Alert and Oriented x 3, Strength and sensation are intact - CN II-Xii grossly intact Psych: euthymic mood, appropriate affect and demeanor   Wt Readings from Last 3 Encounters:  05/14/19 236 lb (107 kg)  09/05/17 216 lb (98 kg)  07/25/17 216 lb (98 kg)    Health Maintenance Due  Topic Date Due  . Hepatitis C Screening  [birth year]  . HIV Screening  01/09/1970  . TETANUS/TDAP  01/09/1974  . COLONOSCOPY  01/09/2005  . INFLUENZA VACCINE  10/07/2018    There are no preventive care reminders to display for this patient.        Assessment & Plan:   Problem List Items Addressed This Visit      Musculoskeletal and Integument   Contact dermatitis and eczema due to plant - Primary    Kenalog 60mg  given IM May take otc claritin qd      Relevant Medications   triamcinolone acetonide (KENALOG-40) injection 60 mg (Start on 05/14/2019  3:30 PM)       Meds ordered this encounter  Medications  . triamcinolone acetonide (KENALOG-40) injection 60 mg     SARA R Laylonie Marzec, PA-C

## 2019-07-10 ENCOUNTER — Telehealth: Payer: Self-pay

## 2019-07-10 NOTE — Telephone Encounter (Signed)
Called and left detailed message of sallys comment.   Advised he could office office and get ov to discus with sally

## 2019-07-10 NOTE — Telephone Encounter (Signed)
Patient called  States he is in lisinopril 40 mg one po qd   And was told by his friends that it may cause cyst in his kidneys  He would like to change to different med such as Atenolol   Uses [facility] on [address]  Callback # [phone]

## 2019-07-10 NOTE — Telephone Encounter (Signed)
Lisinopril is an ACE - actually kidney protectant Atenolol is beta blocker which can slow heart rate, etc Can discuss at next visit - won't just change without discussion

## 2019-07-31 ENCOUNTER — Ambulatory Visit: Payer: BC Managed Care – PPO | Admitting: Orthopaedic Surgery

## 2019-07-31 ENCOUNTER — Encounter: Payer: Self-pay | Admitting: Orthopaedic Surgery

## 2019-07-31 ENCOUNTER — Other Ambulatory Visit: Payer: Self-pay

## 2019-07-31 ENCOUNTER — Ambulatory Visit: Payer: Self-pay

## 2019-07-31 VITALS — Ht 69.5 in | Wt 232.0 lb

## 2019-07-31 DIAGNOSIS — M25562 Pain in left knee: Secondary | ICD-10-CM

## 2019-07-31 DIAGNOSIS — G8929 Other chronic pain: Secondary | ICD-10-CM

## 2019-07-31 DIAGNOSIS — M1712 Unilateral primary osteoarthritis, left knee: Secondary | ICD-10-CM | POA: Diagnosis not present

## 2019-07-31 HISTORY — DX: Unilateral primary osteoarthritis, left knee: M17.12

## 2019-07-31 MED ORDER — METHYLPREDNISOLONE ACETATE 40 MG/ML IJ SUSP
80.0000 mg | INTRAMUSCULAR | Status: AC | PRN
  Administered 2019-07-31: 80 mg via INTRA_ARTICULAR

## 2019-07-31 MED ORDER — BUPIVACAINE HCL 0.25 % IJ SOLN
2.0000 mL | INTRAMUSCULAR | Status: AC | PRN
  Administered 2019-07-31: 2 mL via INTRA_ARTICULAR

## 2019-07-31 MED ORDER — LIDOCAINE HCL 1 % IJ SOLN
3.0000 mL | INTRAMUSCULAR | Status: AC | PRN
  Administered 2019-07-31: 3 mL

## 2019-07-31 MED ORDER — LIDOCAINE HCL 1 % IJ SOLN
2.0000 mL | INTRAMUSCULAR | Status: AC | PRN
  Administered 2019-07-31: 2 mL

## 2019-07-31 NOTE — Progress Notes (Signed)
Office Visit Note   Patient: Maxwell Williams           Date of Birth: 1955-08-10           MRN: [id number] Visit Date: 07/31/2019              Requested by: Blane Ohara, MD [address] [place],  [place] [address] PCP: Blane Ohara, MD   Assessment & Plan: Visit Diagnoses:  1. Chronic pain of left knee   2. Unilateral primary osteoarthritis, left knee     Plan:  #1: Aspiration of the knee was performed yielding at least 55 mL of clear yellow fluid.  This was then injected with Depo-Medrol and Xylocaine and Marcaine. #2: He will obtain Voltaren gel and can use that also intermittently as an anti-inflammatory. #3: Certainly he would be a candidate for a total joint replacement when his symptoms get to the point inability to contain his pain.   Follow-Up Instructions: Return if symptoms worsen or fail to improve.   Orders:  Orders Placed This Encounter  Procedures  . Large Joint Inj  . XR KNEE 3 VIEW LEFT   No orders of the defined types were placed in this encounter.     Procedures: Large Joint Inj: L knee on 07/31/2019 3:17 PM Indications: pain and diagnostic evaluation Details: 25 G 1.5 in needle, anteromedial approach  Arthrogram: No  Medications: 2 mL lidocaine 1 %; 80 mg methylPREDNISolone acetate 40 MG/ML; 3 mL lidocaine 1 %; 2 mL bupivacaine 0.25 % Aspirate: 55 mL clear and yellow Outcome: tolerated well, no immediate complications Procedure, treatment alternatives, risks and benefits explained, specific risks discussed. Consent was given by the patient. Immediately prior to procedure a time out was called to verify the correct patient, procedure, equipment, support staff and site/side marked as required. Patient was prepped and draped in the usual sterile fashion.       Clinical Data: No additional findings.   Subjective: Chief Complaint  Patient presents with  . Left Knee - Pain   HPI Patient presents today for left knee pain. He was  last here in 2019 for his left knee.  He also has had a partial medial meniscectomy of the left knee in the past.  Warm and there is like no airflow he said that it swells and hurts. In certain positions he can feel grinding. He works on his feet 9 hours each day. He has fallen multiple times because his knee will not support him. He has pick his leg up when getting into his [employer]iece because of weakness. He was taking Naproxen and states that it was helpful, but stopped taking.  Also he has been using diclofenac gel intermittently.  He had an arthroscopy with Dr.Whitfield in 2019. He states that his right hip hurts due to walking differently to compensate for the left knee pain.  Because of his abnormal gait he has developed pain also in the right iliac crest area.  He also complains of right knee pain.   Review of Systems  Constitutional: Negative for fatigue.  HENT: Negative for ear pain.   Eyes: Negative for pain.  Respiratory: Negative for shortness of breath.   Cardiovascular: Negative for leg swelling.  Gastrointestinal: Positive for diarrhea. Negative for constipation.  Endocrine: Negative for cold intolerance and heat intolerance.  Genitourinary: Negative for difficulty urinating.  Musculoskeletal: Positive for joint swelling.  Skin: Negative for rash.  Allergic/Immunologic: Negative for food allergies.  Neurological: Positive for  weakness.  Hematological: Does not bruise/bleed easily.  Psychiatric/Behavioral: Negative for sleep disturbance.     Objective: Vital Signs: Ht 5' 9.5" (1.765 m)   Wt 232 lb (105.2 kg)   BMI 33.77 kg/m   Physical Exam Constitutional:      Appearance: Normal appearance. He is well-developed. He is obese.  HENT:     Head: Normocephalic.  Eyes:     Pupils: Pupils are equal, round, and reactive to light.  Pulmonary:     Effort: Pulmonary effort is normal. No respiratory distress.  Skin:    General: Skin is warm and dry.  Neurological:     Mental  Status: He is alert and oriented to person, place, and time.  Psychiatric:        Behavior: Behavior normal.     Ortho Exam  Exam today reveals a 2+ effusion of the knee without warmth or erythema.  He lacks about 5 to 7 degrees of full extension.  He flexes to about 100 degrees.  He does have some crepitance with range of motion of the knee.  Patellar trapping does cause some pain also.  Calf is supple nontender.  Neuro vas intact distally.  He does have some tenderness over the right iliac crest.  Specialty Comments:  No specialty comments available.  Imaging: XR KNEE 3 VIEW LEFT  Result Date: 07/31/2019 Three-view x-ray of the left knee reveals essentially bone-on-bone medial compartment OA with periarticular spurring noted medially.  Some cystic changes certainly in the medial tibial plateau and in the medial epicondyles.  He also has's marked degenerative changes in the patellofemoral joint.    PMFS History: Current Outpatient Medications  Medication Sig Dispense Refill  . atorvastatin (LIPITOR) 10 MG tablet Take 10 mg by mouth daily.    . busPIRone (BUSPAR) 10 MG tablet Take 10 mg by mouth 2 (two) times daily.    . diclofenac Sodium (VOLTAREN) 1 % GEL Apply to affected areas bid as needed 100 g 2  . lisinopril (ZESTRIL) 40 MG tablet Take 1 tablet (40 mg total) by mouth daily. 90 tablet 1   No current facility-administered medications for this visit.    Patient Active Problem List   Diagnosis Date Noted  . Unilateral primary osteoarthritis, left knee 07/31/2019  . Contact dermatitis and eczema due to plant 05/14/2019   Past Medical History:  Diagnosis Date  . Hypertension     Family History  Problem Relation Age of Onset  . COPD Mother     Past Surgical History:  Procedure Laterality Date  . KNEE SURGERY    . WISDOM TOOTH EXTRACTION    . wisom     Social History   Occupational History  . Not on file  Tobacco Use  . Smoking status: Former Research scientist (life sciences)  .  Smokeless tobacco: Former Network engineer and Sexual Activity  . Alcohol use: No  . Drug use: No  . Sexual activity: Not on file

## 2019-08-09 DIAGNOSIS — R7303 Prediabetes: Secondary | ICD-10-CM | POA: Diagnosis not present

## 2019-08-09 DIAGNOSIS — I10 Essential (primary) hypertension: Secondary | ICD-10-CM | POA: Diagnosis not present

## 2019-08-09 DIAGNOSIS — R9431 Abnormal electrocardiogram [ECG] [EKG]: Secondary | ICD-10-CM | POA: Diagnosis not present

## 2019-10-18 ENCOUNTER — Other Ambulatory Visit: Payer: Self-pay | Admitting: Physician Assistant

## 2019-10-19 ENCOUNTER — Other Ambulatory Visit: Payer: Self-pay | Admitting: Physician Assistant

## 2019-11-07 DIAGNOSIS — S61211A Laceration without foreign body of left index finger without damage to nail, initial encounter: Secondary | ICD-10-CM | POA: Diagnosis not present

## 2019-11-16 ENCOUNTER — Other Ambulatory Visit: Payer: Self-pay | Admitting: Physician Assistant

## 2020-01-14 ENCOUNTER — Other Ambulatory Visit: Payer: Self-pay | Admitting: Family Medicine

## 2020-04-20 ENCOUNTER — Other Ambulatory Visit: Payer: Self-pay | Admitting: Physician Assistant

## 2020-04-21 ENCOUNTER — Other Ambulatory Visit: Payer: Self-pay | Admitting: Physician Assistant

## 2020-06-25 ENCOUNTER — Other Ambulatory Visit: Payer: Self-pay

## 2020-06-25 MED ORDER — LISINOPRIL 40 MG PO TABS: ORAL_TABLET | ORAL | 0 refills | Status: DC

## 2020-06-25 MED ORDER — BUSPIRONE HCL 10 MG PO TABS: ORAL_TABLET | ORAL | 0 refills | Status: DC

## 2020-06-25 NOTE — Telephone Encounter (Signed)
Made chronic appointment for tomorrow.   Lorita Officer, [place] 06/25/20 9:28 AM

## 2020-06-26 ENCOUNTER — Encounter: Payer: Self-pay | Admitting: Physician Assistant

## 2020-06-26 ENCOUNTER — Other Ambulatory Visit: Payer: Self-pay

## 2020-06-26 ENCOUNTER — Ambulatory Visit: Payer: BC Managed Care – PPO | Admitting: Physician Assistant

## 2020-06-26 VITALS — BP 150/90 | HR 78 | Temp 97.9°F | Resp 16 | Ht 69.5 in | Wt 229.0 lb

## 2020-06-26 DIAGNOSIS — E782 Mixed hyperlipidemia: Secondary | ICD-10-CM | POA: Diagnosis not present

## 2020-06-26 DIAGNOSIS — I10 Essential (primary) hypertension: Secondary | ICD-10-CM | POA: Diagnosis not present

## 2020-06-26 NOTE — Progress Notes (Signed)
Subjective:  Patient ID: Maxwell Williams, male    DOB: [birth year]  Age: 65 y.o. MRN: [id number]  Chief Complaint  Patient presents with  . Hypertension  . Hyperlipidemia    HPI  Pt presents for follow up of hypertension. The patient is tolerating the medication well without side effects. Compliance with treatment has been good; including taking medication as directed , maintains a healthy diet and regular exercise regimen , and following up as directed. Is taking zestril 40mg  qd  Pt states that he has not been taking his cholesterol medication - says he does not tolerate statins - is making appt for a complete physical and fasting labwork  Current Outpatient Medications on File Prior to Visit  Medication Sig Dispense Refill  . busPIRone (BUSPAR) 10 MG tablet TAKE 1 TABLET(10 MG) BY MOUTH TWICE DAILY 60 tablet 0  . diclofenac Sodium (VOLTAREN) 1 % GEL Apply to affected areas bid as needed 100 g 2  . lisinopril (ZESTRIL) 40 MG tablet TAKE 1 TABLET(40 MG) BY MOUTH DAILY 30 tablet 0   No current facility-administered medications on file prior to visit.   Past Medical History:  Diagnosis Date  . Hypertension    Past Surgical History:  Procedure Laterality Date  . KNEE SURGERY    . WISDOM TOOTH EXTRACTION    . wisom      Family History  Problem Relation Age of Onset  . COPD Mother    Social History   Socioeconomic History  . Marital status: Single    Spouse name: Not on file  . Number of children: Not on file  . Years of education: Not on file  . Highest education level: Not on file  Occupational History  . Not on file  Tobacco Use  . Smoking status: Former Smoker    Quit date: 1996    Years since quitting: 26.3  . Smokeless tobacco: Former  . Vaping Use: Never used  Substance and Sexual Activity  . Alcohol use: Yes    Alcohol/week: 2.0 standard drinks    Types: 2 Glasses of wine per week    Comment: ocassionally  . Drug use: No  . Sexual  activity: Not Currently  Other Topics Concern  . Not on file  Social History Narrative  . Not on file   Social Determinants of Health   Financial Resource Strain: Not on file  Food Insecurity: Not on file  Transportation Needs: Not on file  Physical Activity: Not on file  Stress: Not on file  Social Connections: Not on file    Review of Systems CONSTITUTIONAL: Negative for chills, fatigue, fever, unintentional weight gain and unintentional weight loss.  CARDIOVASCULAR: Negative for chest pain, dizziness, palpitations and pedal edema.  RESPIRATORY: Negative for recent cough and dyspnea.   PSYCHIATRIC: Negative for sleep disturbance and to question depression screen.  Negative for depression, negative for anhedonia.      Objective:  BP (!) 150/90   Pulse 78   Temp 97.9 F (36.6 C)   Resp 16   Ht 5' 9.5" (1.765 m)   Wt 229 lb (103.9 kg)   SpO2 97%   BMI 33.33 kg/m   BP/Weight 06/26/2020 07/31/2019 05/14/2019  Systolic BP 150 - 160  Diastolic BP 90 - 84  Wt. (Lbs) 229 232 236  BMI 33.33 33.77 34.35    Physical Exam PHYSICAL EXAM:   VS: BP (!) 150/90   Pulse 78   Temp 97.9 F (  36.6 C)   Resp 16   Ht 5' 9.5" (1.765 m)   Wt 229 lb (103.9 kg)   SpO2 97%   BMI 33.33 kg/m   GEN: Well nourished, well developed, in no acute distress  Cardiac: RRR; no murmurs, rubs, or gallops,no edema -  Respiratory:  normal respiratory rate and pattern with no distress - normal breath sounds with no rales, rhonchi, wheezes or rubs Psych: euthymic mood, appropriate affect and demeanor  Diabetic Foot Exam - Simple   No data filed      No results found for: WBC, HGB, HCT, PLT, GLUCOSE, CHOL, TRIG, HDL, LDLDIRECT, LDLCALC, ALT, AST, NA, K, CL, CREATININE, BUN, CO2, TSH, PSA, INR, GLUF, HGBA1C, MICROALBUR    Assessment & Plan:   1. Essential hypertension Recommend to continue zestril 40mg  qd Return for labwork and physical 2. Mixed hyperlipidemia  recommend to continue to  watch diet and return for fasting labwork No orders of the defined types were placed in this encounter.   No orders of the defined types were placed in this encounter.    Follow-up: Return in about 2 weeks (around 07/10/2020).  An After Visit Summary was printed and given to the patient.  09/09/2020 [facility] [phone]

## 2020-07-09 ENCOUNTER — Encounter: Payer: Self-pay | Admitting: Physician Assistant

## 2020-07-09 ENCOUNTER — Ambulatory Visit (INDEPENDENT_AMBULATORY_CARE_PROVIDER_SITE_OTHER): Payer: BC Managed Care – PPO | Admitting: Physician Assistant

## 2020-07-09 ENCOUNTER — Other Ambulatory Visit: Payer: Self-pay

## 2020-07-09 VITALS — BP 130/82 | HR 85 | Temp 97.5°F | Ht 69.5 in | Wt 230.0 lb

## 2020-07-09 DIAGNOSIS — M2550 Pain in unspecified joint: Secondary | ICD-10-CM | POA: Diagnosis not present

## 2020-07-09 DIAGNOSIS — Z0000 Encounter for general adult medical examination without abnormal findings: Secondary | ICD-10-CM | POA: Diagnosis not present

## 2020-07-09 NOTE — Progress Notes (Signed)
Subjective:  Patient ID: Maxwell Williams, male    DOB: 01/26/1956  Age: 65 y.o. MRN: [id number]  Chief Complaint  Patient presents with  . Annual Exam    HPI  Well Adult Physical: Patient here for a comprehensive physical exam.The patient reports complaints of intermittent joint pains 'all over - would like arthritis panel drawn to rule out RA Do you take any herbs or supplements that were not prescribed by a doctor? no Are you taking calcium supplements? no Are you taking aspirin daily? no  Encounter for general adult medical examination without abnormal findings  Physical ("At Risk" items are starred): Patient's last physical exam was several years ago .  Smoking: Life-long non-smoker ;  Physical Activity: Exercises at least 3 times per week ;  Alcohol/Drug Use: drinks weekly ; No illicit drug use ;  Patient is not afflicted from Stress Incontinence and Urge Incontinence  Safety: reviewed ; Patient wears a seat belt, has smoke detectors, has carbon monoxide detectors, practices appropriate gun safety, and wears sunscreen with extended sun exposure. Dental Care: biannual cleanings, brushes and flosses daily. Ophthalmology/Optometry: Annual visit.  Hearing loss: none Vision impairments: wears glasses Last PSA: is due  [employer] Visit from 06/26/2020 in [facility]  PHQ-2 Total Score 0              Social History   Socioeconomic History  . Marital status: Single    Spouse name: Not on file  . Number of children: Not on file  . Years of education: Not on file  . Highest education level: Not on file  Occupational History  . Not on file  Tobacco Use  . Smoking status: Former Smoker    Quit date: 1996    Years since quitting: 26.3  . Smokeless tobacco: Former Clinical biochemist  . Vaping Use: Never used  Substance and Sexual Activity  . Alcohol use: Yes    Alcohol/week: 2.0 standard drinks    Types: 2 Glasses of wine per week    Comment: ocassionally   . Drug use: No  . Sexual activity: Not Currently  Other Topics Concern  . Not on file  Social History Narrative  . Not on file   Social Determinants of Health   Financial Resource Strain: Not on file  Food Insecurity: Not on file  Transportation Needs: Not on file  Physical Activity: Not on file  Stress: Not on file  Social Connections: Not on file   Past Medical History:  Diagnosis Date  . Hypertension    Past Surgical History:  Procedure Laterality Date  . KNEE SURGERY    . WISDOM TOOTH EXTRACTION    . wisom      Family History  Problem Relation Age of Onset  . COPD Mother    Social History   Socioeconomic History  . Marital status: Single    Spouse name: Not on file  . Number of children: Not on file  . Years of education: Not on file  . Highest education level: Not on file  Occupational History  . Not on file  Tobacco Use  . Smoking status: Former Smoker    Quit date: 1996    Years since quitting: 26.3  . Smokeless tobacco: Former Clinical biochemist  . Vaping Use: Never used  Substance and Sexual Activity  . Alcohol use: Yes    Alcohol/week: 2.0 standard drinks    Types: 2 Glasses of wine per week  Comment: ocassionally  . Drug use: No  . Sexual activity: Not Currently  Other Topics Concern  . Not on file  Social History Narrative  . Not on file   Social Determinants of Health   Financial Resource Strain: Not on file  Food Insecurity: Not on file  Transportation Needs: Not on file  Physical Activity: Not on file  Stress: Not on file  Social Connections: Not on file   Review of Systems CONSTITUTIONAL: Negative for chills, fatigue, fever, unintentional weight gain and unintentional weight loss.  E/N/T: Negative for ear pain, nasal congestion and sore throat.  CARDIOVASCULAR: Negative for chest pain, dizziness, palpitations and pedal edema.  RESPIRATORY: Negative for recent cough and dyspnea.  GASTROINTESTINAL: Negative for abdominal pain,  acid reflux symptoms, constipation, diarrhea, nausea and vomiting.  MSK: see HPI INTEGUMENTARY: Negative for rash.  NEUROLOGICAL: Negative for dizziness and headaches.  PSYCHIATRIC: Negative for sleep disturbance and to question depression screen.  Negative for depression, negative for anhedonia.       Objective:  BP 130/82 (BP Location: Right Arm, Patient Position: Sitting, Cuff Size: Normal)   Pulse 85   Temp (!) 97.5 F (36.4 C) (Temporal)   Ht 5' 9.5" (1.765 m)   Wt 230 lb (104.3 kg)   SpO2 95%   BMI 33.48 kg/m   BP/Weight 07/09/2020 06/26/2020 07/31/2019  Systolic BP 130 150 -  Diastolic BP 82 90 -  Wt. (Lbs) 230 229 232  BMI 33.48 33.33 33.77    Physical Exam PHYSICAL EXAM:   VS: BP 130/82 (BP Location: Right Arm, Patient Position: Sitting, Cuff Size: Normal)   Pulse 85   Temp (!) 97.5 F (36.4 C) (Temporal)   Ht 5' 9.5" (1.765 m)   Wt 230 lb (104.3 kg)   SpO2 95%   BMI 33.48 kg/m   GEN: Well nourished, well developed, in no acute distress  HEENT: normal external ears and nose - normal external auditory canals and TMS - hearing grossly normal - normal nasal mucosa and septum - Lips, Teeth and Gums - normal  Oropharynx - normal mucosa, palate, and posterior pharynx Neck: no JVD or masses - no thyromegaly Cardiac: RRR; no murmurs, rubs, or gallops,no edema - no significant varicosities Respiratory:  normal respiratory rate and pattern with no distress - normal breath sounds with no rales, rhonchi, wheezes or rubs GI: normal bowel sounds, no masses or tenderness MS: no deformity or atrophy  Skin: warm and dry, no rash  Neuro:  Alert and Oriented x 3, Strength and sensation are intact - CN II-Xii grossly intact Psych: euthymic mood, appropriate affect and demeanor  No results found for: WBC, HGB, HCT, PLT, GLUCOSE, CHOL, TRIG, HDL, LDLDIRECT, LDLCALC, ALT, AST, NA, K, CL, CREATININE, BUN, CO2, TSH, PSA, INR, GLUF, HGBA1C, MICROALBUR    Assessment & Plan:  1.  Routine physical examination - CBC with Differential/Platelet - Comprehensive metabolic panel - TSH - Lipid panel - PSA  2. Arthralgia, unspecified joint - Rheumatoid factor - CYCLIC CITRUL PEPTIDE ANTIBODY, IGG/IGA - Sedimentation rate - C-reactive protein - ANA w/Reflex - Uric acid - Parvovirus B19 antibody, IgG and IgM    Body mass index is 33.48 kg/m.   These are the goals we discussed: Goals   None      This is a list of the screening recommended for you and due dates:  Health Maintenance  Topic Date Due  . COVID-19 Vaccine (3 - Booster for Moderna series) 01/14/2020  . Tetanus Vaccine  06/26/2021*  . Flu Shot  10/06/2020  . Cologuard (Stool DNA test)  10/22/2020  . HPV Vaccine  Aged Out  .  Hepatitis C: One time screening is recommended by Center for Disease Control  (CDC) for  adults born from [birth year] through 1966.   Discontinued  . HIV Screening  Discontinued  . Pneumonia vaccines  Discontinued  *Topic was postponed. The date shown is not the original due date.     AN INDIVIDUALIZED CARE PLAN: was established or reinforced today.   SELF MANAGEMENT: The patient and I together assessed ways to personally work towards obtaining the recommended goals  Support needs The patient and/or family needs were assessed and services were offered if appropriate.  No orders of the defined types were placed in this encounter.    Follow-up: Return in about 6 months (around 01/09/2021) for chronic fasting.  An After Visit Summary was printed and given to the patient.  Jettie Pagan [facility] [phone]

## 2020-07-09 NOTE — Patient Instructions (Signed)
Preventive Care 65 Years and Older, Male Preventive care refers to lifestyle choices and visits with your health care provider that can promote health and wellness. This includes:  A yearly physical exam. This is also called an annual wellness visit.  Regular dental and eye exams.  Immunizations.  Screening for certain conditions.  Healthy lifestyle choices, such as: ? Eating a healthy diet. ? Getting regular exercise. ? Not using drugs or products that contain nicotine and tobacco. ? Limiting alcohol use. What can I expect for my preventive care visit? Physical exam Your health care provider will check your:  Height and weight. These may be used to calculate your BMI (body mass index). BMI is a measurement that tells if you are at a healthy weight.  Heart rate and blood pressure.  Body temperature.  Skin for abnormal spots. Counseling Your health care provider may ask you questions about your:  Past medical problems.  Family's medical history.  Alcohol, tobacco, and drug use.  Emotional well-being.  Home life and relationship well-being.  Sexual activity.  Diet, exercise, and sleep habits.  History of falls.  Memory and ability to understand (cognition).  Work and work environment.  Access to firearms. What immunizations do I need? Vaccines are usually given at various ages, according to a schedule. Your health care provider will recommend vaccines for you based on your age, medical history, and lifestyle or other factors, such as travel or where you work.   What tests do I need? Blood tests  Lipid and cholesterol levels. These may be checked every 5 years, or more often depending on your overall health.  Hepatitis C test.  Hepatitis B test. Screening  Lung cancer screening. You may have this screening every year starting at age 55 if you have a 30-pack-year history of smoking and currently smoke or have quit within the past 15 years.  Colorectal  cancer screening. ? All adults should have this screening starting at age 50 and continuing until age 75. ? Your health care provider may recommend screening at age 45 if you are at increased risk. ? You will have tests every 1-10 years, depending on your results and the type of screening test.  Prostate cancer screening. Recommendations will vary depending on your family history and other risks.  Genital exam to check for testicular cancer or hernias.  Diabetes screening. ? This is done by checking your blood sugar (glucose) after you have not eaten for a while (fasting). ? You may have this done every 1-3 years.  Abdominal aortic aneurysm (AAA) screening. You may need this if you are a current or former smoker.  STD (sexually transmitted disease) testing, if you are at risk. Follow these instructions at home: Eating and drinking  Eat a diet that includes fresh fruits and vegetables, whole grains, lean protein, and low-fat dairy products. Limit your intake of foods with high amounts of sugar, saturated fats, and salt.  Take vitamin and mineral supplements as recommended by your health care provider.  Do not drink alcohol if your health care provider tells you not to drink.  If you drink alcohol: ? Limit how much you have to 0-2 drinks a day. ? Be aware of how much alcohol is in your drink. In the [place] one drink equals one 12 oz bottle of beer (355 mL), one 5 oz glass of wine (148 mL), or one 1 oz glass of hard liquor (44 mL).   Lifestyle  Take daily care of your teeth   and gums. Brush your teeth every morning and night with fluoride toothpaste. Floss one time each day.  Stay active. Exercise for at least 30 minutes 5 or more days each week.  Do not use any products that contain nicotine or tobacco, such as cigarettes, e-cigarettes, and chewing tobacco. If you need help quitting, ask your health care provider.  Do not use drugs.  If you are sexually active, practice safe sex.  Use a condom or other form of protection to prevent STIs (sexually transmitted infections).  Talk with your health care provider about taking a low-dose aspirin or statin.  Find healthy ways to cope with stress, such as: ? Meditation, yoga, or listening to music. ? Journaling. ? Talking to a trusted person. ? Spending time with friends and family. Safety  Always wear your seat belt while driving or riding in a vehicle.  Do not drive: ? If you have been drinking alcohol. Do not ride with someone who has been drinking. ? When you are tired or distracted. ? While texting.  Wear a helmet and other protective equipment during sports activities.  If you have firearms in your house, make sure you follow all gun safety procedures. What's next?  Visit your health care provider once a year for an annual wellness visit.  Ask your health care provider how often you should have your eyes and teeth checked.  Stay up to date on all vaccines. This information is not intended to replace advice given to you by your health care provider. Make sure you discuss any questions you have with your health care provider. Document Revised: 11/21/2018 Document Reviewed: 02/16/2018 Elsevier Patient Education  2021 Elsevier Inc.  

## 2020-07-11 LAB — CBC WITH DIFFERENTIAL/PLATELET
Basophils Absolute: 0.1 10*3/uL (ref 0.0–0.2)
Basos: 1 %
EOS (ABSOLUTE): 0.4 10*3/uL (ref 0.0–0.4)
Eos: 5 %
Hematocrit: 44.4 % (ref 37.5–51.0)
Hemoglobin: 14.5 g/dL (ref 13.0–17.7)
Immature Grans (Abs): 0 10*3/uL (ref 0.0–0.1)
Immature Granulocytes: 1 %
Lymphocytes Absolute: 1.4 10*3/uL (ref 0.7–3.1)
Lymphs: 18 %
MCH: 29.6 pg (ref 26.6–33.0)
MCHC: 32.7 g/dL (ref 31.5–35.7)
MCV: 91 fL (ref 79–97)
Monocytes Absolute: 0.6 10*3/uL (ref 0.1–0.9)
Monocytes: 7 %
Neutrophils Absolute: 5.6 10*3/uL (ref 1.4–7.0)
Neutrophils: 68 %
Platelets: 224 10*3/uL (ref 150–450)
RBC: 4.9 x10E6/uL (ref 4.14–5.80)
RDW: 12.8 % (ref 11.6–15.4)
WBC: 8.1 10*3/uL (ref 3.4–10.8)

## 2020-07-11 LAB — COMPREHENSIVE METABOLIC PANEL
ALT: 22 IU/L (ref 0–44)
AST: 24 IU/L (ref 0–40)
Albumin/Globulin Ratio: 2 (ref 1.2–2.2)
Albumin: 4.7 g/dL (ref 3.8–4.8)
Alkaline Phosphatase: 73 IU/L (ref 44–121)
BUN/Creatinine Ratio: 21 (ref 10–24)
BUN: 20 mg/dL (ref 8–27)
Bilirubin Total: 0.6 mg/dL (ref 0.0–1.2)
CO2: 20 mmol/L (ref 20–29)
Calcium: 10 mg/dL (ref 8.6–10.2)
Chloride: 101 mmol/L (ref 96–106)
Creatinine, Ser: 0.96 mg/dL (ref 0.76–1.27)
Globulin, Total: 2.4 g/dL (ref 1.5–4.5)
Glucose: 137 mg/dL — ABNORMAL HIGH (ref 65–99)
Potassium: 4.1 mmol/L (ref 3.5–5.2)
Sodium: 141 mmol/L (ref 134–144)
Total Protein: 7.1 g/dL (ref 6.0–8.5)
eGFR: 88 mL/min/{1.73_m2} (ref >59)

## 2020-07-11 LAB — LIPID PANEL
Chol/HDL Ratio: 4.2 ratio (ref 0.0–5.0)
Cholesterol, Total: 229 mg/dL — ABNORMAL HIGH (ref 100–199)
HDL: 54 mg/dL (ref >39)
LDL Chol Calc (NIH): 132 mg/dL — ABNORMAL HIGH (ref 0–99)
Triglycerides: 245 mg/dL — ABNORMAL HIGH (ref 0–149)
VLDL Cholesterol Cal: 43 mg/dL — ABNORMAL HIGH (ref 5–40)

## 2020-07-11 LAB — PARVOVIRUS B19 ANTIBODY, IGG AND IGM
Parvovirus B19 IgG: 2.9 index — ABNORMAL HIGH (ref 0.0–0.8)
Parvovirus B19 IgM: 0.1 index (ref 0.0–0.8)

## 2020-07-11 LAB — C-REACTIVE PROTEIN: CRP: 5 mg/L (ref 0–10)

## 2020-07-11 LAB — CARDIOVASCULAR RISK ASSESSMENT

## 2020-07-11 LAB — ANA W/REFLEX: Anti Nuclear Antibody (ANA): NEGATIVE

## 2020-07-11 LAB — SEDIMENTATION RATE: Sed Rate: 14 mm/hr (ref 0–30)

## 2020-07-11 LAB — TSH: TSH: 1.2 u[IU]/mL (ref 0.450–4.500)

## 2020-07-11 LAB — PSA: Prostate Specific Ag, Serum: 2.9 ng/mL (ref 0.0–4.0)

## 2020-07-11 LAB — RHEUMATOID FACTOR: Rheumatoid fact SerPl-aCnc: 35 IU/mL — ABNORMAL HIGH (ref <14.0)

## 2020-07-11 LAB — URIC ACID: Uric Acid: 6.6 mg/dL (ref 3.8–8.4)

## 2020-07-11 LAB — CYCLIC CITRUL PEPTIDE ANTIBODY, IGG/IGA: Cyclic Citrullin Peptide Ab: 5 units (ref 0–19)

## 2020-07-14 ENCOUNTER — Other Ambulatory Visit: Payer: Self-pay | Admitting: Physician Assistant

## 2020-07-14 DIAGNOSIS — R7309 Other abnormal glucose: Secondary | ICD-10-CM

## 2020-07-14 DIAGNOSIS — M0580 Other rheumatoid arthritis with rheumatoid factor of unspecified site: Secondary | ICD-10-CM

## 2020-07-14 DIAGNOSIS — M2550 Pain in unspecified joint: Secondary | ICD-10-CM

## 2020-07-17 LAB — SPECIMEN STATUS REPORT

## 2020-07-17 LAB — HGB A1C W/O EAG: Hgb A1c MFr Bld: 6.8 % — ABNORMAL HIGH (ref 4.8–5.6)

## 2020-07-23 ENCOUNTER — Other Ambulatory Visit: Payer: Self-pay | Admitting: Physician Assistant

## 2020-07-24 ENCOUNTER — Encounter: Payer: Self-pay | Admitting: Physician Assistant

## 2020-07-24 ENCOUNTER — Ambulatory Visit: Payer: Self-pay

## 2020-07-24 ENCOUNTER — Ambulatory Visit: Payer: BC Managed Care – PPO | Admitting: Orthopaedic Surgery

## 2020-07-24 ENCOUNTER — Other Ambulatory Visit: Payer: Self-pay

## 2020-07-24 ENCOUNTER — Encounter: Payer: Self-pay | Admitting: Orthopaedic Surgery

## 2020-07-24 VITALS — Ht 69.5 in | Wt 230.0 lb

## 2020-07-24 DIAGNOSIS — M1711 Unilateral primary osteoarthritis, right knee: Secondary | ICD-10-CM | POA: Diagnosis not present

## 2020-07-24 DIAGNOSIS — M25561 Pain in right knee: Secondary | ICD-10-CM

## 2020-07-24 HISTORY — DX: Unilateral primary osteoarthritis, right knee: M17.11

## 2020-07-24 MED ORDER — BUPIVACAINE HCL 0.25 % IJ SOLN
2.0000 mL | INTRAMUSCULAR | Status: AC | PRN
  Administered 2020-07-24: 2 mL via INTRA_ARTICULAR

## 2020-07-24 MED ORDER — LIDOCAINE HCL 1 % IJ SOLN
2.0000 mL | INTRAMUSCULAR | Status: AC | PRN
  Administered 2020-07-24: 2 mL

## 2020-07-24 NOTE — Progress Notes (Signed)
Office Visit Note   Patient: Maxwell Williams           Date of Birth: 1955-12-11           MRN: [id number] Visit Date: 07/24/2020              Requested by: Marianne Sofia, PA-C [address] [place],  [place] [address] PCP: Marianne Sofia, PA-C   Assessment & Plan: Visit Diagnoses:  1. Acute pain of right knee   2. Unilateral primary osteoarthritis, right knee     Plan: Films demonstrate osteoarthritis of right knee.  He did have a large effusion which I aspirated and injected cortisone with significant relief of his pain.  He is aware of the diagnosis and we have discussed treatment options.  Follow-Up Instructions: Return if symptoms worsen or fail to improve.   Orders:  Orders Placed This Encounter  Procedures  . Large Joint Inj: R knee  . XR KNEE 3 VIEW RIGHT   No orders of the defined types were placed in this encounter.     Procedures: Large Joint Inj: R knee on 07/24/2020 5:26 PM Indications: pain and diagnostic evaluation Details: 25 G 1.5 in needle, medial approach  Arthrogram: No  Medications: 2 mL lidocaine 1 %; 2 mL bupivacaine 0.25 % Aspirate: 60 mL clear and yellow Outcome: tolerated well, no immediate complications  12 mg betamethasone injected into right knee with Marcaine and Xylocaine after aspiration Procedure, treatment alternatives, risks and benefits explained, specific risks discussed. Consent was given by the patient. Immediately prior to procedure a time out was called to verify the correct patient, procedure, equipment, support staff and site/side marked as required. Patient was prepped and draped in the usual sterile fashion.       Clinical Data: No additional findings.   Subjective: Chief Complaint  Patient presents with  . Right Knee - Pain  Patient presents today for right knee pain. He said that it started two weeks ago after working outside in the yard. His knee hurts medially and is swollen. He said that he finds it difficult to  sleep at night. He works on his feet for 9 hours each day and states that it does not help. No previous right knee surgery.   HPI  Review of Systems   Objective: Vital Signs: Ht 5' 9.5" (1.765 m)   Wt 230 lb (104.3 kg)   BMI 33.48 kg/m   Physical Exam Constitutional:      Appearance: He is well-developed.  Eyes:     Pupils: Pupils are equal, round, and reactive to light.  Pulmonary:     Effort: Pulmonary effort is normal.  Skin:    General: Skin is warm and dry.  Neurological:     Mental Status: He is alert and oriented to person, place, and time.  Psychiatric:        Behavior: Behavior normal.     Ortho Exam right knee with positive effusion.  Some mild medial joint pain.  Just about full extension about 100 degrees of flexion without instability.  No popliteal pain or mass.  No calf pain.  Specialty Comments:  No specialty comments available.  Imaging: XR KNEE 3 VIEW RIGHT  Result Date: 07/24/2020 Films of the right knee obtained in 3 projections standing.  There are arthritic changes similar to those in the left knee but to a lesser extent.  There is significant narrowing of the medial joint space with about 3 degrees of  varus.  Subchondral sclerosis noted on both sides of the joint with small peripheral osteophytes.  No ectopic calcification.  Minimal changes laterally but some patellofemoral chondromalacia    PMFS History: Patient Active Problem List   Diagnosis Date Noted  . Unilateral primary osteoarthritis, right knee 07/24/2020  . Routine physical examination 07/09/2020  . Arthralgia 07/09/2020  . Unilateral primary osteoarthritis, left knee 07/31/2019  . Contact dermatitis and eczema due to plant 05/14/2019  . Mixed hyperlipidemia 09/22/2015  . Bilateral carpal tunnel syndrome 09/22/2015  . Generalized OA 09/22/2015  . Essential hypertension 09/18/2015  . Fatigue 09/18/2015  . High risk medication use 09/18/2015   Past Medical History:  Diagnosis  Date  . Hypertension     Family History  Problem Relation Age of Onset  . COPD Mother     Past Surgical History:  Procedure Laterality Date  . KNEE SURGERY    . WISDOM TOOTH EXTRACTION    . wisom     Social History   Occupational History  . Not on file  Tobacco Use  . Smoking status: Former Smoker    Quit date: 1996    Years since quitting: 26.3  . Smokeless tobacco: Former Clinical biochemist  . Vaping Use: Never used  Substance and Sexual Activity  . Alcohol use: Yes    Alcohol/week: 2.0 standard drinks    Types: 2 Glasses of wine per week    Comment: ocassionally  . Drug use: No  . Sexual activity: Not Currently

## 2020-07-29 ENCOUNTER — Other Ambulatory Visit: Payer: Self-pay

## 2020-07-29 ENCOUNTER — Other Ambulatory Visit: Payer: Self-pay | Admitting: Physician Assistant

## 2020-07-29 DIAGNOSIS — E119 Type 2 diabetes mellitus without complications: Secondary | ICD-10-CM

## 2020-07-29 MED ORDER — BUSPIRONE HCL 10 MG PO TABS: ORAL_TABLET | ORAL | 0 refills | Status: DC

## 2020-07-29 MED ORDER — ROSUVASTATIN CALCIUM 5 MG PO TABS: 5.0000 mg | ORAL_TABLET | Freq: Every day | ORAL | 2 refills | Status: DC

## 2020-07-29 MED ORDER — LISINOPRIL 40 MG PO TABS: 40.0000 mg | ORAL_TABLET | Freq: Every day | ORAL | 1 refills | Status: DC

## 2020-08-14 ENCOUNTER — Encounter: Payer: BC Managed Care – PPO | Attending: Physician Assistant | Admitting: Dietician

## 2020-08-14 ENCOUNTER — Encounter: Payer: Self-pay | Admitting: Dietician

## 2020-08-14 ENCOUNTER — Other Ambulatory Visit: Payer: Self-pay

## 2020-08-14 DIAGNOSIS — E119 Type 2 diabetes mellitus without complications: Secondary | ICD-10-CM | POA: Diagnosis not present

## 2020-08-14 NOTE — Progress Notes (Signed)
Patient was seen on 08/14/2020 for the first of a series of three diabetes self-management courses at the [facility].  Patient Education Plan per assessed needs and concerns is to attend three course education program for Diabetes Self Management Education.  The following learning objectives were met by the patient during this class: Describe diabetes, types of diabetes and pathophysiology State some common risk factors for diabetes Defines the role of glucose and insulin Describe the relationship between diabetes and cardiovascular and other risks State the members of the Healthcare Team States the rationale for glucose monitoring and when to test State their individual [place] the importance of logging glucose readings and how to interpret the readings Identifies A1C target Explain the correlation between A1c and eAG values State symptoms and treatment of high blood glucose and low blood glucose Explain proper technique for glucose testing and identify proper sharps disposal  Handouts given during class include: How to Thrive:  A Guide for Your Journey with Diabetes by the ADA Meal Plan Card and carbohydrate content list Dietary intake form Low Sodium Flavoring Tips Types of Fats Dining Out Label reading Snack list Planning a balanced meal The diabetes portion plate Diabetes Resources A1c to eAG Conversion Chart Blood Glucose Log Diabetes Recommended Care Schedule Support Group Diabetes Success Plan Core Class Satisfaction Survey   Follow-Up Plan: Attend core 2

## 2020-08-21 ENCOUNTER — Ambulatory Visit: Payer: BC Managed Care – PPO | Admitting: Internal Medicine

## 2020-08-21 ENCOUNTER — Ambulatory Visit: Payer: Self-pay

## 2020-08-21 ENCOUNTER — Other Ambulatory Visit: Payer: Self-pay

## 2020-08-21 ENCOUNTER — Encounter: Payer: Self-pay | Admitting: Internal Medicine

## 2020-08-21 VITALS — BP 172/92 | HR 64 | Resp 16 | Ht 68.75 in | Wt 225.2 lb

## 2020-08-21 DIAGNOSIS — M79641 Pain in right hand: Secondary | ICD-10-CM

## 2020-08-21 DIAGNOSIS — M1711 Unilateral primary osteoarthritis, right knee: Secondary | ICD-10-CM | POA: Diagnosis not present

## 2020-08-21 DIAGNOSIS — M1712 Unilateral primary osteoarthritis, left knee: Secondary | ICD-10-CM

## 2020-08-21 DIAGNOSIS — M0580 Other rheumatoid arthritis with rheumatoid factor of unspecified site: Secondary | ICD-10-CM | POA: Diagnosis not present

## 2020-08-21 DIAGNOSIS — G5603 Carpal tunnel syndrome, bilateral upper limbs: Secondary | ICD-10-CM

## 2020-08-21 DIAGNOSIS — M79642 Pain in left hand: Secondary | ICD-10-CM | POA: Diagnosis not present

## 2020-08-21 LAB — SYNOVIAL FLUID ANALYSIS, COMPLETE
Basophils, %: 0 %
Eosinophils-Synovial: 0 % (ref 0–2)
Lymphocytes-Synovial Fld: 22 % (ref 0–74)
Monocyte/Macrophage: 69 % (ref 0–69)
Neutrophil, Synovial: 9 % (ref 0–24)
Synoviocytes, %: 0 % (ref 0–15)
WBC, Synovial: 360 cells/uL — ABNORMAL HIGH (ref <150)

## 2020-08-21 LAB — TIQ-NTM

## 2020-08-21 MED ORDER — LIDOCAINE HCL 1 % IJ SOLN
3.0000 mL | INTRAMUSCULAR | Status: AC | PRN
  Administered 2020-08-21: 3 mL

## 2020-08-21 MED ORDER — PREDNISONE 10 MG PO TABS
ORAL_TABLET | ORAL | 0 refills | Status: AC
Start: 2020-08-21 — End: 2020-09-11

## 2020-08-21 NOTE — Patient Instructions (Signed)
Prednisone tablets What is this medication? PREDNISONE (PRED ni sone) is a corticosteroid. It is commonly used to treat inflammation of the skin, joints, lungs, and other organs. Common conditions treated include asthma, allergies, and arthritis. It is also used for otherconditions, such as blood disorders and diseases of the adrenal glands. This medicine may be used for other purposes; ask your health care provider orpharmacist if you have questions. COMMON BRAND NAME(S): Deltasone, Predone, Sterapred, Sterapred DS What should I tell my care team before I take this medication? They need to know if you have any of these conditions: Cushing's syndrome diabetes glaucoma heart disease high blood pressure infection (especially a virus infection such as chickenpox, cold sores, or herpes) kidney disease liver disease mental illness myasthenia gravis osteoporosis seizures stomach or intestine problems thyroid disease an unusual or allergic reaction to lactose, prednisone, other medicines, foods, dyes, or preservatives pregnant or trying to get pregnant breast-feeding How should I use this medication? Take this medicine by mouth with a glass of water. Follow the directions on the prescription label. Take this medicine with food. If you are taking this medicine once a day, take it in the morning. Do not take more medicine than you are told to take. Do not suddenly stop taking your medicine because you may develop a severe reaction. Your doctor will tell you how much medicine to take. If your doctor wants you to stop the medicine, the dose may be slowly loweredover time to avoid any side effects. Talk to your pediatrician regarding the use of this medicine in children.Special care may be needed. Overdosage: If you think you have taken too much of this medicine contact apoison control center or emergency room at once. NOTE: This medicine is only for you. Do not share this medicine with others. What  if I miss a dose? If you miss a dose, take it as soon as you can. If it is almost time for your next dose, talk to your doctor or health care professional. You may need to miss a dose or take an extra dose. Do not take double or extra doses withoutadvice. What may interact with this medication? Do not take this medicine with any of the following medications: metyrapone mifepristone This medicine may also interact with the following medications: aminoglutethimide amphotericin B aspirin and aspirin-like medicines barbiturates certain medicines for diabetes, like glipizide or glyburide cholestyramine cholinesterase inhibitors cyclosporine digoxin diuretics ephedrine male hormones, like estrogens and birth control pills isoniazid ketoconazole NSAIDS, medicines for pain and inflammation, like ibuprofen or naproxen phenytoin rifampin toxoids vaccines warfarin This list may not describe all possible interactions. Give your health care provider a list of all the medicines, herbs, non-prescription drugs, or dietary supplements you use. Also tell them if you smoke, drink alcohol, or use illegaldrugs. Some items may interact with your medicine. What should I watch for while using this medication? Visit your doctor or health care professional for regular checks on your progress. If you are taking this medicine over a prolonged period, carry an identification card with your name and address, the type and dose of yourmedicine, and your doctor's name and address. This medicine may increase your risk of getting an infection. Tell your doctor or health care professional if you are around anyone with measles orchickenpox, or if you develop sores or blisters that do not heal properly. If you are going to have surgery, tell your doctor or health care professionalthat you have taken this medicine within the last twelve months. Ask   your doctor or health care professional about your diet. You may need  tolower the amount of salt you eat. This medicine may increase blood sugar. Ask your healthcare provider if changesin diet or medicines are needed if you have diabetes. What side effects may I notice from receiving this medication? Side effects that you should report to your doctor or health care professionalas soon as possible: allergic reactions like skin rash, itching or hives, swelling of the face, lips, or tongue changes in emotions or moods changes in vision depressed mood eye pain fever or chills, cough, sore throat, pain or difficulty passing urine signs and symptoms of high blood sugar such as being more thirsty or hungry or having to urinate more than normal. You may also feel very tired or have blurry vision. swelling of ankles, feet Side effects that usually do not require medical attention (report to yourdoctor or health care professional if they continue or are bothersome): confusion, excitement, restlessness headache nausea, vomiting skin problems, acne, thin and shiny skin trouble sleeping weight gain This list may not describe all possible side effects. Call your doctor for medical advice about side effects. You may report side effects to FDA at1-800-FDA-1088. Where should I keep my medication? Keep out of the reach of children. Store at room temperature between 15 and 30 degrees C (59 and 86 degrees F). Protect from light. Keep container tightly closed. Throw away any unusedmedicine after the expiration date. NOTE: This sheet is a summary. It may not cover all possible information. If you have questions about this medicine, talk to your doctor, pharmacist, orhealth care provider.  2022 Elsevier/Gold Standard (2017-11-22 10:54:22)  

## 2020-08-21 NOTE — Progress Notes (Signed)
Office Visit Note  Patient: Maxwell Williams             Date of Birth: [birth year]           MRN: [id number]             PCP: Marianne Sofia, PA-C Referring: Marianne Sofia, PA-C Visit Date: 08/21/2020 Occupation: [employer]  Subjective:  Other (Bilateral knee pain, recent knee aspiration and injection by Dr. Cleophas Dunker. Pain and numbness in left hand. )   History of Present Illness: Maxwell Williams is a 65 y.o. male here for evaluation of joint pain in multiple areas and positive rheumatoid factor. He has a history of previous left knee meniscal injury with arthroscopic repair 4 or 5 years ago and chronic knee osteoarthritis bilaterally.  He has a history of previous cervical disc injury with some radiculopathy on the left side and also past left shoulder dislocation playing football decades ago but upper extremity symptoms have been doing well until around the start of this year with worsening numbness in his right thumb and swelling in the right wrist.  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  During the past approximately 6 weeks he has noticed worsening swelling and pain in the right knee.  This was seen by Dr. Cleophas Dunker with orthopedics who obtain updated x-rays showing osteoarthritis without acute injury and had joint aspiration and injection with steroid with limited benefit he has redeveloped the pain and swelling already.  He takes naproxen once daily that he started many years ago for his neck symptoms and also his left knee arthritis this is partially helpful but the current symptoms.  He has not suffered any fall or giving way of his knee but feels the right knee is not stable enough to support him if he were off balance.  He does not notice swelling redness or heat of other joints.  Labs reviewed ANA neg RF 35 CCP neg ESR 14 CRP 5 CBC wnl CMP wnl TSH 1.2 Uric acid 6.6   Imaging reviewed 2019 Knee MRI, xray Meniscal tear  with large joint effusion, osteophytes and subchondral sclerosis present  Activities of Daily Living:  Patient reports morning stiffness for all day. Patient Reports nocturnal pain.  Difficulty dressing/grooming: Denies Difficulty climbing stairs: Reports Difficulty getting out of chair: Reports Difficulty using hands for taps, buttons, cutlery, and/or writing: Reports  Review of Systems  Constitutional:  Negative for fatigue.  HENT:  Negative for mouth sores, mouth dryness and nose dryness.   Eyes:  Negative for pain, itching and dryness.  Respiratory:  Negative for shortness of breath and difficulty breathing.   Cardiovascular:  Negative for chest pain and palpitations.  Gastrointestinal:  Negative for blood in stool, constipation and diarrhea.  Endocrine: Negative for increased urination.  Genitourinary:  Negative for difficulty urinating.  Musculoskeletal:  Positive for joint pain, gait problem, joint pain, joint swelling and morning stiffness. Negative for myalgias, muscle tenderness and myalgias.  Skin:  Negative for color change, rash and redness.  Allergic/Immunologic: Negative for susceptible to infections.  Neurological:  Positive for numbness. Negative for dizziness, headaches, memory loss and weakness.  Hematological:  Negative for bruising/bleeding tendency.  Psychiatric/Behavioral:  Negative for confusion.    PMFS History:  Patient Active Problem List   Diagnosis Date Noted   Polyarthritis with positive rheumatoid factor (HCC) 08/21/2020   Unilateral primary osteoarthritis, right knee 07/24/2020   Routine physical examination 07/09/2020  Arthralgia 07/09/2020   Unilateral primary osteoarthritis, left knee 07/31/2019   Contact dermatitis and eczema due to plant 05/14/2019   Mixed hyperlipidemia 09/22/2015   Bilateral carpal tunnel syndrome 09/22/2015   Generalized OA 09/22/2015   Essential hypertension 09/18/2015   Fatigue 09/18/2015   High risk medication use  09/18/2015    Past Medical History:  Diagnosis Date   Hypertension     Family History  Problem Relation Age of Onset   COPD Mother    Breast cancer Mother    Ovarian cancer Mother    Stroke Father    Prostate cancer Brother    Past Surgical History:  Procedure Laterality Date   KNEE SURGERY Left    WISDOM TOOTH EXTRACTION     wisom     Social History   Social History Narrative   Not on file   Immunization History  Administered Date(s) Administered   Moderna Sars-Covid-2 Vaccination 06/16/2019, 07/14/2019     Objective: Vital Signs: BP (!) 172/92 (BP Location: Right Arm, Patient Position: Sitting, Cuff Size: Normal)   Pulse 64   Resp 16   Ht 5' 8.75" (1.746 m)   Wt 225 lb 3.2 oz (102.2 kg)   BMI 33.50 kg/m    Physical Exam Constitutional:      Appearance: He is obese.  Cardiovascular:     Rate and Rhythm: Normal rate and regular rhythm.  Pulmonary:     Effort: Pulmonary effort is normal.     Breath sounds: Normal breath sounds.  Skin:    General: Skin is warm and dry.     Findings: No rash.  Neurological:     General: No focal deficit present.     Mental Status: He is alert.  Psychiatric:        Mood and Affect: Mood normal.     Musculoskeletal Exam:  Neck full ROM no tenderness Shoulders full ROM, right shoulder pain with end abduction external rotation, positive Hawkins and Neer's Elbows full ROM no tenderness or swelling Wrists full ROM right side normal, left side palpable swelling dorsally along radial aspect, ultrasound inspection showing synovial thickening and positive color Doppler Fingers full ROM, Heberden's nodes present throughout, no palpable synovitis or tenderness Knees left side crepitus present with full range of motion no swelling, right knee decreased extension range of motion tenderness to palpation joint effusion present there is also 1+ pitting edema from the knee down to the ankle on the right side, hot to touch laterally at the  knee  Ankles full ROM no tenderness or swelling   CDAI Exam: CDAI Score: 13  Patient Global: 50 mm; Provider Global: 30 mm Swollen: 2 ; Tender: 3  Joint Exam 08/21/2020      Right  Left  Glenohumeral      Tender  Wrist     Swollen Tender  Knee  Swollen Tender        Investigation: No additional findings.  Imaging: XR Hand 2 View Left  Result Date: 08/21/2020 Xray left hand 2 views Radiocarpal joint space is normal. Moderately advanced 1st CMC joint osteoarthritis. MCP joints appear intact. PIP joint spaces intact 2nd digit lateral osteophyte formation, DIPs joints more generally involved. No erosive changes or demineralization seen. Impression Osteoarthritis most advanced in 1st [facility] joint and less in PIP and DIP joint spaces with no evidence of inflammation joint disease  XR KNEE 3 VIEW RIGHT  Result Date: 07/24/2020 Films of the right knee obtained in 3 projections standing.  There  are arthritic changes similar to those in the left knee but to a lesser extent.  There is significant narrowing of the medial joint space with about 3 degrees of varus.  Subchondral sclerosis noted on both sides of the joint with small peripheral osteophytes.  No ectopic calcification.  Minimal changes laterally but some patellofemoral chondromalacia   Recent Labs: Lab Results  Component Value Date   WBC 8.1 07/09/2020   HGB 14.5 07/09/2020   PLT 224 07/09/2020   NA 141 07/09/2020   K 4.1 07/09/2020   CL 101 07/09/2020   CO2 20 07/09/2020   GLUCOSE 137 (H) 07/09/2020   BUN 20 07/09/2020   CREATININE 0.96 07/09/2020   BILITOT 0.6 07/09/2020   ALKPHOS 73 07/09/2020   AST 24 07/09/2020   ALT 22 07/09/2020   PROT 7.1 07/09/2020   ALBUMIN 4.7 07/09/2020   CALCIUM 10.0 07/09/2020    Speciality Comments: No specialty comments available.  Procedures:  Large Joint Inj: R knee on 08/21/2020 8:51 AM Indications: diagnostic evaluation and joint swelling Details: 18 G 1.5 in needle, superolateral  approach Medications: 3 mL lidocaine 1 % Aspirate: 24 mL yellow and clear; sent for lab analysis Outcome: tolerated well, no immediate complications Consent was given by the patient. Immediately prior to procedure a time out was called to verify the correct patient, procedure, equipment, support staff and site/side marked as required. Patient was prepped and draped in the usual sterile fashion.    Allergies: Patient has no known allergies.   Assessment / Plan:     Visit Diagnoses: Polyarthritis with positive rheumatoid factor ([place]) - Plan: predniSONE (DELTASONE) 10 MG tablet, XR Hand 2 View Right, XR Hand 2 View Left, Synovial Fluid Analysis, Complete, Large Joint Inj: R knee  Joint pain of multiple areas with some active swelling in right knee and left wrist today. There is osteoarthritis change as well throughout hands and knees. With positive RF and synovial changes but negative inflammatory serology and OA not clear whether active inflammatory process. Aspiration of right knee for fluid analysis today. Xray bilateral hands to review degenerative vs erosive changes, and recommend he try a course of oral prednisone low dose for 2-3 weeks to see degree of response in short term while evaluating.  Bilateral carpal tunnel syndrome  Left thumb numbness is not typical for CTS, negative tinels and phalens. Could have compressive symptoms related to wrist and thumb disease.  Unilateral primary osteoarthritis, right knee Unilateral primary osteoarthritis, left knee  Left knee is pretty stable today without complaints, right knee aspirated with bland appearing effusion sent for analysis to rule out inflammatory process.   Orders: Orders Placed This Encounter  Procedures   Large Joint Inj: R knee   XR Hand 2 View Right   XR Hand 2 View Left   Synovial Fluid Analysis, Complete   [id number]    Meds ordered this encounter  Medications   predniSONE (DELTASONE) 10 MG tablet    Sig: Take 2  tablets (20 mg total) by mouth daily with breakfast for 7 days, THEN 1 tablet (10 mg total) daily with breakfast for 14 days.    Dispense:  28 tablet    Refill:  0     Follow-Up Instructions: Return in about 3 weeks (around 09/11/2020) for New pt f/u+RF arthritis, influenza vaccination.   Collier Salina, MD  Note - This record has been created using [employer].  Chart creation errors have been sought, but may not always  have  been located. Such creation errors do not reflect on  the standard of medical care.

## 2020-08-28 ENCOUNTER — Encounter: Payer: Self-pay | Admitting: Radiology

## 2020-08-28 ENCOUNTER — Encounter: Payer: BC Managed Care – PPO | Admitting: Dietician

## 2020-08-28 ENCOUNTER — Other Ambulatory Visit: Payer: Self-pay

## 2020-08-28 ENCOUNTER — Encounter: Payer: Self-pay | Admitting: Dietician

## 2020-08-28 ENCOUNTER — Other Ambulatory Visit: Payer: Self-pay | Admitting: Physician Assistant

## 2020-08-28 DIAGNOSIS — E119 Type 2 diabetes mellitus without complications: Secondary | ICD-10-CM

## 2020-08-28 NOTE — Progress Notes (Signed)
Analysis of the fluid from the knee aspiration shows no evidence of inflammation. There are also no crystals such as would be seen in gout. This makes rheumatoid arthritis unlikely as a cause of the symptoms. Xrays of the hands shows a lot of osteoarthritis also without rheumatoid arthritis changes. We can follow up as planned and see how symptoms are doing.

## 2020-08-28 NOTE — Progress Notes (Signed)
Patient was seen on 08/28/2020 for the second and third courses of diabetes self-management courses at the [facility].  **Class 2 and 3 were combined due to a power outage necessitating that class 2 be postponed**  The following learning objectives were met by the patient during this class: (Class 2) Describe the role of different macronutrients on glucose Explain how carbohydrates affect blood glucose State what foods contain the most carbohydrates Demonstrate carbohydrate counting Demonstrate how to read Nutrition Facts food label Describe effects of various fats on heart health Describe the importance of good nutrition for health and healthy eating strategies Describe techniques for managing your shopping, cooking and meal planning List strategies to follow meal plan when dining out Describe the effects of alcohol on glucose and how to use it safely (Class 3) State the amount of activity recommended for healthy living Describe activities suitable for individual needs Identify ways to regularly incorporate activity into daily life Identify barriers to activity and ways to over come these barriers Identify diabetes medications being personally used and their primary action for lowering glucose and possible side effects Describe role of stress on blood glucose and develop strategies to address psychosocial issues Identify diabetes complications and ways to prevent them Explain how to manage diabetes during illness Evaluate success in meeting personal goal Establish 2-3 goals that they will plan to diligently work on  Goals:  (Class 2) Follow Diabetes Meal Plan as instructed  Aim to spread carbs evenly throughout the day  Aim for 3 meals per day and snacks as needed Include lean protein foods to meals/snacks  Monitor glucose levels as instructed by your doctor  (Class 3) I will count my carb choices at most meals and snacks Keep within guidelines I  will be active 30 minutes or more 4 times a week Weight lifting for cardio I will eat less unhealthy fats by eating less pork products  Your patient has identified these potential barriers to change:  Stress  Your patient has identified their diabetes self-care support plan as  None identified    Plan:  Attend Support Group as desired

## 2020-09-16 ENCOUNTER — Ambulatory Visit: Payer: BC Managed Care – PPO | Admitting: Internal Medicine

## 2020-09-16 ENCOUNTER — Other Ambulatory Visit: Payer: Self-pay

## 2020-09-16 ENCOUNTER — Encounter: Payer: Self-pay | Admitting: Internal Medicine

## 2020-09-16 VITALS — BP 118/70 | HR 98 | Ht 68.5 in | Wt 216.0 lb

## 2020-09-16 DIAGNOSIS — M0580 Other rheumatoid arthritis with rheumatoid factor of unspecified site: Secondary | ICD-10-CM | POA: Diagnosis not present

## 2020-09-16 DIAGNOSIS — M25461 Effusion, right knee: Secondary | ICD-10-CM | POA: Diagnosis not present

## 2020-09-16 DIAGNOSIS — M159 Polyosteoarthritis, unspecified: Secondary | ICD-10-CM

## 2020-09-16 NOTE — Progress Notes (Signed)
Office Visit Note  Patient: Maxwell Williams             Date of Birth: November 28, 1955           MRN: [id number]             PCP: Marge Duncans, PA-C Referring: Marge Duncans, PA-C Visit Date: 09/16/2020   Subjective:  Follow-up (Patient had right knee aspiration on 08/21/2020 and noticed immediate relief X 2 weeks. Recently, patient has developed increased right knee swelling now making it difficult to walk and work. )   History of Present Illness: Maxwell Williams is a 65 y.o. male here for follow up for joint pains, effusion, and positive rheumatoid factor. At his last visit joint aspiration of the knee with an appreciable effusion volume but synovial fluid was negative for crystals or elevated neutrophil count. He was also recommended trial of a few weeks low dose prednisone treatment to reassess symptom response.  He noticed immediate improvement in symptoms after the aspiration but fluids and swelling reaccumulated within days with return of knee pain and decreased range of motion within about 2 weeks.  Currently symptoms have become severe enough again he is having difficulty walking normally and standing for work.  Previous HPI: 08/21/20 BOMANI Williams is a 65 y.o. male here for evaluation of joint pain in multiple areas and positive rheumatoid factor. He has a history of previous left knee meniscal injury with arthroscopic repair 4 or 5 years ago and chronic knee osteoarthritis bilaterally.  He has a history of previous cervical disc injury with some radiculopathy on the left side and also past left shoulder dislocation playing football decades ago but upper extremity symptoms have been doing well until around the start of this year with worsening numbness in his right thumb and swelling in the right wrist.  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  During the past approximately 6 weeks he has noticed worsening swelling and pain in the right knee.   This was seen by Dr. Durward Fortes with orthopedics who obtain updated x-rays showing osteoarthritis without acute injury and had joint aspiration and injection with steroid with limited benefit he has redeveloped the pain and swelling already.  He takes naproxen once daily that he started many years ago for his neck symptoms and also his left knee arthritis this is partially helpful but the current symptoms.  He has not suffered any fall or giving way of his knee but feels the right knee is not stable enough to support him if he were off balance.  He does not notice swelling redness or heat of other joints.   Labs reviewed ANA neg RF 35 CCP neg ESR 14 CRP 5 CBC wnl CMP wnl TSH 1.2 Uric acid 6.6     Imaging reviewed 2019 Knee MRI, xray Meniscal tear with large joint effusion, osteophytes and subchondral sclerosis present  Review of Systems  Constitutional:  Negative for fatigue.  HENT:  Negative for mouth sores, mouth dryness and nose dryness.   Eyes:  Negative for pain, itching, visual disturbance and dryness.  Respiratory:  Negative for cough, hemoptysis, shortness of breath and difficulty breathing.   Cardiovascular:  Negative for chest pain, palpitations and swelling in legs/feet.  Gastrointestinal:  Negative for abdominal pain, blood in stool, constipation and diarrhea.  Endocrine: Negative for increased urination.  Genitourinary:  Negative for painful urination.  Musculoskeletal:  Positive for joint pain, joint  pain, joint swelling, muscle weakness and morning stiffness. Negative for myalgias, muscle tenderness and myalgias.  Skin:  Negative for color change, rash and redness.  Allergic/Immunologic: Negative for susceptible to infections.  Neurological:  Positive for numbness. Negative for dizziness, headaches, memory loss and weakness.  Hematological:  Negative for swollen glands.  Psychiatric/Behavioral:  Negative for confusion and sleep disturbance.    PMFS History:  Patient  Active Problem List   Diagnosis Date Noted   Effusion, right knee 09/16/2020   Polyarthritis with positive rheumatoid factor ([place]) 08/21/2020   Unilateral primary osteoarthritis, right knee 07/24/2020   Routine physical examination 07/09/2020   Arthralgia 07/09/2020   Unilateral primary osteoarthritis, left knee 07/31/2019   Contact dermatitis and eczema due to plant 05/14/2019   Mixed hyperlipidemia 09/22/2015   Bilateral carpal tunnel syndrome 09/22/2015   Generalized OA 09/22/2015   Essential hypertension 09/18/2015   Fatigue 09/18/2015   High risk medication use 09/18/2015    Past Medical History:  Diagnosis Date   Hypertension     Family History  Problem Relation Age of Onset   COPD Mother    Breast cancer Mother    Ovarian cancer Mother    Stroke Father    Prostate cancer Brother    Past Surgical History:  Procedure Laterality Date   KNEE SURGERY Left    WISDOM TOOTH EXTRACTION     wisom     Social History   Social History Narrative   Not on file   Immunization History  Administered Date(s) Administered   Moderna Sars-Covid-2 Vaccination 06/16/2019, 07/14/2019     Objective: Vital Signs: BP 118/70 (BP Location: Left Arm, Patient Position: Sitting, Cuff Size: Normal)   Pulse 98   Ht 5' 8.5" (1.74 m)   Wt 216 lb (98 kg)   BMI 32.36 kg/m    Physical Exam Skin:    General: Skin is warm and dry.     Findings: No rash.  Neurological:     General: No focal deficit present.     Mental Status: He is alert.    Musculoskeletal Exam:  Wrists full ROM no tenderness or swelling Fingers full ROM no tenderness or swelling Knees right knee tense swelling decreased flexion and extension range of motion, mild tenderness to palpation, no warmth to the touch no erythema Ankles full ROM no tenderness or swelling  Investigation: No additional findings.  Imaging: XR Hand 2 View Left  Result Date: 08/21/2020 Xray left hand 2 views Radiocarpal joint space is  normal. Moderately advanced 1st CMC joint osteoarthritis. MCP joints appear intact. PIP joint spaces intact 2nd digit lateral osteophyte formation, DIPs joints more generally involved. No erosive changes or demineralization seen. Impression Osteoarthritis most advanced in 1st [facility] joint and less in PIP and DIP joint spaces with no evidence of inflammation joint disease  XR Hand 2 View Right  Result Date: 08/21/2020 Xray right hand 2 views Radiocarpal joint space asymmetric mildly increased sclerosis. Ulnar styloid absent, fragment present lateral to ulnar head. Very advanced 1st CMC joint arthritis and osteophytosis and subluxation. Slight narrowing of 2nd and 3rd MCPs without other changes. Diffuse PIP and DIP degenerative changes with asymmetric narrowing and osteophytes. Mild periosteal reaction of proximal phalanges. No erosive changes or demineralization. 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  Procedures:  Large Joint Inj: R knee on 09/16/2020 3:54 PM Indications: joint swelling and pain Details: (21 g) 1.5 in needle, superolateral approach Aspirate: 65 mL yellow and clear Outcome: tolerated well, no immediate complications Consent was given by the patient. Patient was prepped and draped in the usual sterile fashion.    Allergies: Patient has no known  allergies.   Assessment / Plan:     Visit Diagnoses: Effusion, right knee - Plan: MR KNEE RIGHT WO CONTRAST, Large Joint Inj: R knee  Rapidly recurring right knee Bland effusion repeat aspiration today for symptomatic relief.  Would recommend for right knee MRI to evaluate she apparently had chronic meniscal tear of the left knee with recurrent effusions in the past.  Based on findings may be an opportunity for surgical improvement and can follow-up with Dr. Durward Fortes as appropriate.  Polyarthritis with positive rheumatoid factor (HCC) - Plan: MR KNEE RIGHT WO CONTRAST  Joint pain of multiple sites but currently inflammatory changes are only present at the knee given the completely bland synovial fluid analysis this is atypical for rheumatoid arthritis based on distribution and lack of inflammatory infiltrate.  Would need more suggestive evidence for active inflammatory disease to recommend trial of DMARD treatment.  Orders: Orders Placed This Encounter  Procedures   Large Joint Inj: R knee   MR KNEE RIGHT WO CONTRAST    No orders of the defined types were placed in this encounter.    Follow-Up Instructions: No follow-ups on file.   Collier Salina, MD  Note - This record has been created using [employer].  Chart creation errors have been sought, but may not always  have been located. Such creation errors do not reflect on  the standard of medical care.

## 2020-09-22 ENCOUNTER — Other Ambulatory Visit: Payer: Self-pay | Admitting: Physician Assistant

## 2020-09-26 ENCOUNTER — Ambulatory Visit
Admission: RE | Admit: 2020-09-26 | Discharge: 2020-09-26 | Disposition: A | Discharge status: Home / Self Care | Payer: BC Managed Care – PPO | Source: Ambulatory Visit | Attending: Internal Medicine | Admitting: Internal Medicine

## 2020-09-26 DIAGNOSIS — M159 Polyosteoarthritis, unspecified: Secondary | ICD-10-CM

## 2020-09-26 DIAGNOSIS — M0580 Other rheumatoid arthritis with rheumatoid factor of unspecified site: Secondary | ICD-10-CM

## 2020-09-26 DIAGNOSIS — M25461 Effusion, right knee: Secondary | ICD-10-CM

## 2020-09-26 DIAGNOSIS — M25561 Pain in right knee: Secondary | ICD-10-CM | POA: Diagnosis not present

## 2020-09-29 ENCOUNTER — Telehealth: Payer: Self-pay | Admitting: Orthopaedic Surgery

## 2020-09-29 NOTE — Telephone Encounter (Signed)
Pt is in a lot of pain and MRI confirmed that he had a a tear. He wants to know if he can be seen this week.  CB [phone]

## 2020-09-29 NOTE — Progress Notes (Signed)
Right knee MRI shows a tear and wasting away of the medial meniscus and degeneration of the lateral meniscus. There is also significant osteoarthritis of the knee joint. Based on this imaging and his bland joint fluid we checked, the problem looks to be more mechanical and functional and not an inflammatory disease. I think he will need to follow up with his orthopedist for this.

## 2020-09-30 ENCOUNTER — Other Ambulatory Visit: Payer: Self-pay

## 2020-09-30 ENCOUNTER — Encounter: Payer: Self-pay | Admitting: Orthopaedic Surgery

## 2020-09-30 ENCOUNTER — Ambulatory Visit: Payer: BC Managed Care – PPO | Admitting: Orthopaedic Surgery

## 2020-09-30 VITALS — Ht 68.5 in | Wt 216.0 lb

## 2020-09-30 DIAGNOSIS — M25461 Effusion, right knee: Secondary | ICD-10-CM

## 2020-09-30 NOTE — Progress Notes (Signed)
Office Visit Note   Patient: Maxwell Williams           Date of Birth: [birth year]           MRN: [id number] Visit Date: 09/30/2020              Requested by: Marianne Sofia, PA-C [address] [place],  [place] [address] PCP: Marianne Sofia, PA-C   Assessment & Plan: Visit Diagnoses:  1. Effusion, right knee     Plan: MRI scan of right knee performed through Dr. Gregary Cromer office.  I reviewed this with and demonstrates a complex tear of the posterior horn of the medial meniscus with peripheral extrusion.  There was degeneration at the posterior horn of the lateral meniscus without a discrete tear.  There was also considerable arthritis in all 3 compartments but predominantly medially with there was extensive full-thickness cartilage loss and subchondral reactive marrow edema.  There was mild partial-thickness cartilage loss of the patella and partial lateral compartment arthritis with partial thickness loss.  Long discussion regarding all of the above including the diagnosis and treatment options.  This would include over-the-counter medicines and recurrent cortisone injections, consideration of arthroscopic debridement which will obviously not resolve the arthritis and even consideration of a knee replacement.  For the moment he like to avoid any surgery and will continue with over-the-counter medicines.  He does have recurrent effusions.  If he should decide on arthroscopy obviously the results would be limited because of the arthritis but he does have some mechanical symptoms and it could be helpful  Follow-Up Instructions: Return if symptoms worsen or fail to improve.   Orders:  No orders of the defined types were placed in this encounter.  No orders of the defined types were placed in this encounter.     Procedures: No procedures performed   Clinical Data: No additional findings.   Subjective: Chief Complaint  Patient presents with   Right Knee - Follow-up    MRI review   Patient presents today for follow up on his right knee. He had an MRI and is here today to go over those results.   HPI  Review of Systems   Objective: Vital Signs: Ht 5' 8.5" (1.74 m)   Wt 216 lb (98 kg)   BMI 32.36 kg/m   Physical Exam Constitutional:      Appearance: He is well-developed.  Eyes:     Pupils: Pupils are equal, round, and reactive to light.  Pulmonary:     Effort: Pulmonary effort is normal.  Skin:    General: Skin is warm and dry.  Neurological:     Mental Status: He is alert and oriented to person, place, and time.  Psychiatric:        Behavior: Behavior normal.    Ortho Exam awake and alert and oriented x3.  Comfortable sitting.  Positive effusion right knee but minimal discomfort.  At full extension and flexed over 100 degrees without instability.  There was some medial joint pain.  Positive patella crepitation but no pain with compression.  No calf pain or distal edema  Specialty Comments:  No specialty comments available.  Imaging: No results found.   PMFS History: Patient Active Problem List   Diagnosis Date Noted   Effusion, right knee 09/16/2020   Polyarthritis with positive rheumatoid factor (HCC) 08/21/2020   Unilateral primary osteoarthritis, right knee 07/24/2020   Routine physical examination 07/09/2020   Arthralgia 07/09/2020   Unilateral primary osteoarthritis,  left knee 07/31/2019   Contact dermatitis and eczema due to plant 05/14/2019   Mixed hyperlipidemia 09/22/2015   Bilateral carpal tunnel syndrome 09/22/2015   Generalized OA 09/22/2015   Essential hypertension 09/18/2015   Fatigue 09/18/2015   High risk medication use 09/18/2015   Past Medical History:  Diagnosis Date   Hypertension     Family History  Problem Relation Age of Onset   COPD Mother    Breast cancer Mother    Ovarian cancer Mother    Stroke Father    Prostate cancer Brother     Past Surgical History:  Procedure Laterality Date   KNEE SURGERY  Left    WISDOM TOOTH EXTRACTION     wisom     Social History   Occupational History   Not on file  Tobacco Use   Smoking status: Former    Types: Cigarettes    Quit date: 1996    Years since quitting: 26.5   Smokeless tobacco: Never  Vaping Use   Vaping Use: Never used  Substance and Sexual Activity   Alcohol use: Yes    Alcohol/week: 2.0 standard drinks    Types: 2 Glasses of wine per week    Comment: ocassionally   Drug use: No   Sexual activity: Not Currently

## 2020-10-22 ENCOUNTER — Other Ambulatory Visit: Payer: Self-pay | Admitting: Physician Assistant

## 2020-11-13 ENCOUNTER — Telehealth: Payer: Self-pay | Admitting: Physician Assistant

## 2020-12-21 ENCOUNTER — Other Ambulatory Visit: Payer: Self-pay | Admitting: Physician Assistant

## 2021-01-08 ENCOUNTER — Ambulatory Visit: Payer: BC Managed Care – PPO | Admitting: Physician Assistant

## 2021-01-13 ENCOUNTER — Ambulatory Visit: Payer: BC Managed Care – PPO | Admitting: Physician Assistant

## 2021-01-15 ENCOUNTER — Ambulatory Visit: Payer: BC Managed Care – PPO | Admitting: Physician Assistant

## 2021-01-15 ENCOUNTER — Encounter: Payer: Self-pay | Admitting: Physician Assistant

## 2021-01-15 ENCOUNTER — Other Ambulatory Visit: Payer: Self-pay

## 2021-01-15 VITALS — BP 140/80 | HR 78 | Temp 97.1°F | Resp 16 | Ht 68.5 in | Wt 221.0 lb

## 2021-01-15 DIAGNOSIS — Z1211 Encounter for screening for malignant neoplasm of colon: Secondary | ICD-10-CM | POA: Diagnosis not present

## 2021-01-15 DIAGNOSIS — M25561 Pain in right knee: Secondary | ICD-10-CM

## 2021-01-15 DIAGNOSIS — I10 Essential (primary) hypertension: Secondary | ICD-10-CM

## 2021-01-15 DIAGNOSIS — E782 Mixed hyperlipidemia: Secondary | ICD-10-CM

## 2021-01-15 DIAGNOSIS — G8929 Other chronic pain: Secondary | ICD-10-CM

## 2021-01-15 NOTE — Progress Notes (Signed)
Subjective:  Patient ID: Maxwell Williams, male    DOB: Oct 22, 1955  Age: 65 y.o. MRN: [id number]  Chief Complaint  Patient presents with   Hypertension   Hyperlipidemia    Hypertension  Hyperlipidemia   Pt presents for follow up of hypertension. The patient is tolerating the medication well without side effects. Compliance with treatment has been good; including taking medication as directed , maintains a healthy diet and regular exercise regimen , and following up as directed. Is taking zestril 40mg  qd  Mixed hyperlipidemia  Pt presents with hyperlipidemia. Compliance with treatment has been good The patient is compliant with medications, maintains a low cholesterol diet , follows up as directed , and maintains an exercise regimen . The patient denies experiencing any hypercholesterolemia related symptoms. Currently on crestor 5mg  qd   Pt with osteoarthritis - currently uses voltaren gel which helps with knee pain - is following with ortho as well   Current Outpatient Medications on File Prior to Visit  Medication Sig Dispense Refill   busPIRone (BUSPAR) 10 MG tablet TAKE 1 TABLET(10 MG) BY MOUTH TWICE DAILY 60 tablet 2   diclofenac Sodium (VOLTAREN) 1 % GEL Apply to affected areas bid as needed 100 g 2   lisinopril (ZESTRIL) 40 MG tablet Take 1 tablet (40 mg total) by mouth daily. 90 tablet 1   rosuvastatin (CRESTOR) 5 MG tablet TAKE 1 TABLET(5 MG) BY MOUTH DAILY 30 tablet 2   No current facility-administered medications on file prior to visit.   Past Medical History:  Diagnosis Date   Bilateral carpal tunnel syndrome 09/22/2015   Hypertension    Mixed hyperlipidemia 09/22/2015   Unilateral primary osteoarthritis, left knee 07/31/2019   Unilateral primary osteoarthritis, right knee 07/24/2020   Past Surgical History:  Procedure Laterality Date   KNEE SURGERY Left    WISDOM TOOTH EXTRACTION     wisom      Family History  Problem Relation Age of Onset   COPD Mother     Breast cancer Mother    Ovarian cancer Mother    Stroke Father    Prostate cancer Brother    Social History   Socioeconomic History   Marital status: Single    Spouse name: Not on file   Number of children: Not on file   Years of education: Not on file   Highest education level: Not on file  Occupational History   Not on file  Tobacco Use   Smoking status: Former    Types: Cigarettes    Quit date: 1996    Years since quitting: 26.8   Smokeless tobacco: Never  Vaping Use   Vaping Use: Never used  Substance and Sexual Activity   Alcohol use: Yes    Alcohol/week: 2.0 standard drinks    Types: 2 Glasses of wine per week    Comment: ocassionally   Drug use: No   Sexual activity: Not Currently  Other Topics Concern   Not on file  Social History Narrative   Not on file   Social Determinants of Health   Financial Resource Strain: Not on file  Food Insecurity: Not on file  Transportation Needs: Not on file  Physical Activity: Not on file  Stress: Not on file  Social Connections: Not on file  CONSTITUTIONAL: Negative for chills, fatigue, fever, unintentional weight gain and unintentional weight loss.  CARDIOVASCULAR: Negative for chest pain, dizziness, palpitations and pedal edema.  RESPIRATORY: Negative for recent cough and dyspnea.  MSK: see HPI  INTEGUMENTARY: Negative for rash.  PSYCHIATRIC: Negative for sleep disturbance and to question depression screen.  Negative for depression, negative for anhedonia.       Objective:  PHYSICAL EXAM:   VS: BP 140/80   Pulse 78   Temp (!) 97.1 F (36.2 C)   Resp 16   Ht 5' 8.5" (1.74 m)   Wt 221 lb (100.2 kg)   SpO2 97%   BMI 33.11 kg/m   GEN: Well nourished, well developed, in no acute distress  Cardiac: RRR; no murmurs, rubs, or gallops,no edema -  Respiratory:  normal respiratory rate and pattern with no distress - normal breath sounds with no rales, rhonchi, wheezes or rubs MS: no deformity or atrophy  Skin: warm  and dry, no rash  Psych: euthymic mood, appropriate affect and demeanor  Diabetic Foot Exam - Simple   No data filed      Lab Results  Component Value Date   WBC 8.1 07/09/2020   HGB 14.5 07/09/2020   HCT 44.4 07/09/2020   PLT 224 07/09/2020   GLUCOSE 137 (H) 07/09/2020   CHOL 229 (H) 07/09/2020   TRIG 245 (H) 07/09/2020   HDL 54 07/09/2020   LDLCALC 132 (H) 07/09/2020   ALT 22 07/09/2020   AST 24 07/09/2020   NA 141 07/09/2020   K 4.1 07/09/2020   CL 101 07/09/2020   CREATININE 0.96 07/09/2020   BUN 20 07/09/2020   CO2 20 07/09/2020   TSH 1.200 07/09/2020   HGBA1C 6.8 (H) 07/09/2020      Assessment & Plan:   1. Essential hypertension Recommend to continue zestril 40mg  qd Labwork pending 2. Mixed hyperlipidemia  recommend to continue to watch diet and continue meds Labwork pending No orders of the defined types were placed in this encounter. 3. Chronic knee pain Continue voltaren as directed 4. Colon cancer screening Cologuard ordered  Orders Placed This Encounter  Procedures   CBC with Differential/Platelet   Comprehensive metabolic panel   Lipid panel   Cologuard      Follow-up: Return in about 6 months (around 07/15/2021) for fasting physical.  An After Visit Summary was printed and given to the patient.  09/14/2021 [facility] [phone]

## 2021-01-16 ENCOUNTER — Other Ambulatory Visit: Payer: Self-pay | Admitting: Physician Assistant

## 2021-01-16 DIAGNOSIS — R7309 Other abnormal glucose: Secondary | ICD-10-CM

## 2021-01-16 LAB — CBC WITH DIFFERENTIAL/PLATELET
Basophils Absolute: 0.1 10*3/uL (ref 0.0–0.2)
Basos: 1 %
EOS (ABSOLUTE): 0.4 10*3/uL (ref 0.0–0.4)
Eos: 5 %
Hematocrit: 44.2 % (ref 37.5–51.0)
Hemoglobin: 15.1 g/dL (ref 13.0–17.7)
Immature Grans (Abs): 0 10*3/uL (ref 0.0–0.1)
Immature Granulocytes: 0 %
Lymphocytes Absolute: 1.5 10*3/uL (ref 0.7–3.1)
Lymphs: 20 %
MCH: 30.7 pg (ref 26.6–33.0)
MCHC: 34.2 g/dL (ref 31.5–35.7)
MCV: 90 fL (ref 79–97)
Monocytes Absolute: 0.6 10*3/uL (ref 0.1–0.9)
Monocytes: 8 %
Neutrophils Absolute: 5.1 10*3/uL (ref 1.4–7.0)
Neutrophils: 66 %
Platelets: 235 10*3/uL (ref 150–450)
RBC: 4.92 x10E6/uL (ref 4.14–5.80)
RDW: 12.3 % (ref 11.6–15.4)
WBC: 7.6 10*3/uL (ref 3.4–10.8)

## 2021-01-16 LAB — COMPREHENSIVE METABOLIC PANEL
ALT: 22 IU/L (ref 0–44)
AST: 22 IU/L (ref 0–40)
Albumin/Globulin Ratio: 2 (ref 1.2–2.2)
Albumin: 4.7 g/dL (ref 3.8–4.8)
Alkaline Phosphatase: 64 IU/L (ref 44–121)
BUN/Creatinine Ratio: 12 (ref 10–24)
BUN: 11 mg/dL (ref 8–27)
Bilirubin Total: 0.5 mg/dL (ref 0.0–1.2)
CO2: 24 mmol/L (ref 20–29)
Calcium: 10 mg/dL (ref 8.6–10.2)
Chloride: 100 mmol/L (ref 96–106)
Creatinine, Ser: 0.9 mg/dL (ref 0.76–1.27)
Globulin, Total: 2.3 g/dL (ref 1.5–4.5)
Glucose: 146 mg/dL — ABNORMAL HIGH (ref 70–99)
Potassium: 4.5 mmol/L (ref 3.5–5.2)
Sodium: 138 mmol/L (ref 134–144)
Total Protein: 7 g/dL (ref 6.0–8.5)
eGFR: 94 mL/min/{1.73_m2} (ref >59)

## 2021-01-16 LAB — LIPID PANEL
Chol/HDL Ratio: 4.1 ratio (ref 0.0–5.0)
Cholesterol, Total: 190 mg/dL (ref 100–199)
HDL: 46 mg/dL (ref >39)
LDL Chol Calc (NIH): 107 mg/dL — ABNORMAL HIGH (ref 0–99)
Triglycerides: 214 mg/dL — ABNORMAL HIGH (ref 0–149)
VLDL Cholesterol Cal: 37 mg/dL (ref 5–40)

## 2021-01-16 LAB — CARDIOVASCULAR RISK ASSESSMENT

## 2021-01-16 MED ORDER — ICOSAPENT ETHYL 1 G PO CAPS: 2.0000 g | ORAL_CAPSULE | Freq: Two times a day (BID) | ORAL | 0 refills | Status: DC

## 2021-01-20 ENCOUNTER — Other Ambulatory Visit: Payer: Self-pay | Admitting: Physician Assistant

## 2021-01-21 LAB — HGB A1C W/O EAG: Hgb A1c MFr Bld: 6.9 % — ABNORMAL HIGH (ref 4.8–5.6)

## 2021-01-21 LAB — SPECIMEN STATUS REPORT

## 2021-01-27 DIAGNOSIS — Z1211 Encounter for screening for malignant neoplasm of colon: Secondary | ICD-10-CM | POA: Diagnosis not present

## 2021-02-02 LAB — COLOGUARD: COLOGUARD: NEGATIVE

## 2021-02-05 NOTE — Telephone Encounter (Signed)
error 

## 2021-03-28 ENCOUNTER — Other Ambulatory Visit: Payer: Self-pay | Admitting: Family Medicine

## 2021-03-28 ENCOUNTER — Other Ambulatory Visit: Payer: Self-pay | Admitting: Physician Assistant

## 2021-04-16 ENCOUNTER — Encounter: Payer: Self-pay | Admitting: Physician Assistant

## 2021-04-16 DIAGNOSIS — E119 Type 2 diabetes mellitus without complications: Secondary | ICD-10-CM | POA: Diagnosis not present

## 2021-04-16 LAB — HM DIABETES EYE EXAM

## 2021-04-27 ENCOUNTER — Other Ambulatory Visit: Payer: Self-pay | Admitting: Physician Assistant

## 2021-07-07 ENCOUNTER — Encounter: Payer: Self-pay | Admitting: Orthopaedic Surgery

## 2021-07-07 ENCOUNTER — Ambulatory Visit: Payer: BC Managed Care – PPO | Admitting: Orthopaedic Surgery

## 2021-07-07 DIAGNOSIS — M1711 Unilateral primary osteoarthritis, right knee: Secondary | ICD-10-CM | POA: Diagnosis not present

## 2021-07-07 MED ORDER — LIDOCAINE HCL 1 % IJ SOLN
2.0000 mL | INTRAMUSCULAR | Status: AC | PRN
  Administered 2021-07-07: 2 mL

## 2021-07-07 MED ORDER — METHYLPREDNISOLONE ACETATE 40 MG/ML IJ SUSP
80.0000 mg | INTRAMUSCULAR | Status: AC | PRN
  Administered 2021-07-07: 80 mg via INTRA_ARTICULAR

## 2021-07-07 MED ORDER — BUPIVACAINE HCL 0.25 % IJ SOLN
2.0000 mL | INTRAMUSCULAR | Status: AC | PRN
  Administered 2021-07-07: 2 mL via INTRA_ARTICULAR

## 2021-07-07 NOTE — Progress Notes (Signed)
? ?Office Visit Note ?  ?Patient: Maxwell Williams           ?Date of Birth: 1955/11/21           ?MRN: [id number] ?Visit Date: 07/07/2021 ?             ?Requested by: Marianne Sofia, PA-C ?[address] ?[address] ?[place],  [place] [address] ?PCP: Marianne Sofia, PA-C ? ? ?Assessment & Plan: ?Visit Diagnoses:  ?1. Unilateral primary osteoarthritis, right knee   ? ? ?Plan: [name] has been seen on multiple occasions for problems referable to both knees.  He has considerable degenerative arthritis.  By film he has more change on the left knee than the right knee but by MRI scan he does have considerable change in the right knee with chondromalacia in all 3 compartments and even a complex tear of the medial meniscus.  In the past we have discussed different treatment options including arthroscopy but he is opted to try over-the-counter medicines and time.  He notes that he has had some recent recurrence of his pain and swelling a little "less than in the past".  He has been taking Aleve as needed and turmeric as well as glucosamine and conjoint sulfate.  He feels like they have made a difference.  Today I aspirated 50 cc of clear fluid from his knee injected cortisone and again have discussed different treatment options.  He is fine with a nonoperative approach.  We will plan to see him back as needed.  I did complete a handicap parking application ? ?Follow-Up Instructions: Return if symptoms worsen or fail to improve.  ? ?Orders:  ?Orders Placed This Encounter  ?Procedures  ? Large Joint Inj: R knee  ? ?No orders of the defined types were placed in this encounter. ? ? ? ? Procedures: ?Large Joint Inj: R knee on 07/07/2021 3:24 PM ?Indications: pain and diagnostic evaluation ?Details: 25 G 1.5 in needle, anteromedial approach ? ?Arthrogram: No ? ?Medications: 2 mL lidocaine 1 %; 80 mg methylPREDNISolone acetate 40 MG/ML; 2 mL bupivacaine 0.25 % ?Aspirate: 50 mL clear ?Outcome: tolerated well, no immediate complications ?Procedure,  treatment alternatives, risks and benefits explained, specific risks discussed. Consent was given by the patient. Immediately prior to procedure a time out was called to verify the correct patient, procedure, equipment, support staff and site/side marked as required. Patient was prepped and draped in the usual sterile fashion.  ? ? ? ? ?Clinical Data: ?No additional findings. ? ? ?Subjective: ?Chief Complaint  ?Patient presents with  ? Right Knee - Pain  ?Patient presents today for chronic right knee pain. He was last here in July of last year. He still does not want to do any type of surgery. He is going to retire on July 1st of this year. He wants to get a handicap placard today.  ? ?HPI ? ?Review of Systems ? ? ?Objective: ?Vital Signs: There were no vitals taken for this visit. ? ?Physical Exam ?Constitutional:   ?   Appearance: He is well-developed.  ?Eyes:  ?   Pupils: Pupils are equal, round, and reactive to light.  ?Pulmonary:  ?   Effort: Pulmonary effort is normal.  ?Skin: ?   General: Skin is warm and dry.  ?Neurological:  ?   Mental Status: He is alert and oriented to person, place, and time.  ?Psychiatric:     ?   Behavior: Behavior normal.  ? ? ?Ortho Exam right knee with  increased varus and mostly medial joint pain.  Very little discomfort laterally and about the patellofemoral joint.  Large effusion.  The knee was minimally warm.  Lacked a few degrees to full extension but after knee aspiration was fully extended.  Flexed about 105 degrees without instability.  No popliteal pain.  No calf pain.  Motor and sensory exam intact.  Painless range of motion both hips ? ?Specialty Comments:  ?No specialty comments available. ? ?Imaging: ?No results found. ? ? ?PMFS History: ?Patient Active Problem List  ? Diagnosis Date Noted  ? Effusion, right knee 09/16/2020  ? Polyarthritis with positive rheumatoid factor (HCC) 08/21/2020  ? Unilateral primary osteoarthritis, right knee 07/24/2020  ? Routine physical  examination 07/09/2020  ? Unilateral primary osteoarthritis, left knee 07/31/2019  ? Mixed hyperlipidemia 09/22/2015  ? Bilateral carpal tunnel syndrome 09/22/2015  ? Generalized OA 09/22/2015  ? Essential hypertension 09/18/2015  ? ?Past Medical History:  ?Diagnosis Date  ? Bilateral carpal tunnel syndrome 09/22/2015  ? Hypertension   ? Mixed hyperlipidemia 09/22/2015  ? Unilateral primary osteoarthritis, left knee 07/31/2019  ? Unilateral primary osteoarthritis, right knee 07/24/2020  ?  ?Family History  ?Problem Relation Age of Onset  ? COPD Mother   ? Breast cancer Mother   ? Ovarian cancer Mother   ? Stroke Father   ? Prostate cancer Brother   ?  ?Past Surgical History:  ?Procedure Laterality Date  ? KNEE SURGERY Left   ? WISDOM TOOTH EXTRACTION    ? wisom    ? ?Social History  ? ?Occupational History  ? Not on file  ?Tobacco Use  ? Smoking status: Former  ?  Types: Cigarettes  ?  Quit date: 53  ?  Years since quitting: 27.3  ? Smokeless tobacco: Never  ?Vaping Use  ? Vaping Use: Never used  ?Substance and Sexual Activity  ? Alcohol use: Yes  ?  Alcohol/week: 2.0 standard drinks  ?  Types: 2 Glasses of wine per week  ?  Comment: ocassionally  ? Drug use: No  ? Sexual activity: Not Currently  ? ? ? ? ? ? ?

## 2021-07-16 ENCOUNTER — Ambulatory Visit (INDEPENDENT_AMBULATORY_CARE_PROVIDER_SITE_OTHER): Payer: BC Managed Care – PPO | Admitting: Physician Assistant

## 2021-07-16 ENCOUNTER — Encounter: Payer: Self-pay | Admitting: Physician Assistant

## 2021-07-16 VITALS — BP 142/80 | HR 80 | Temp 98.6°F | Resp 18 | Ht 68.5 in | Wt 227.4 lb

## 2021-07-16 DIAGNOSIS — Z2821 Immunization not carried out because of patient refusal: Secondary | ICD-10-CM | POA: Diagnosis not present

## 2021-07-16 DIAGNOSIS — Z0000 Encounter for general adult medical examination without abnormal findings: Secondary | ICD-10-CM

## 2021-07-16 DIAGNOSIS — T25221A Burn of second degree of right foot, initial encounter: Secondary | ICD-10-CM

## 2021-07-16 MED ORDER — CEPHALEXIN 500 MG PO CAPS: 500.0000 mg | ORAL_CAPSULE | Freq: Two times a day (BID) | ORAL | 0 refills | Status: DC
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
## 2021-07-17 ENCOUNTER — Other Ambulatory Visit: Payer: Self-pay | Admitting: Physician Assistant

## 2021-07-17 DIAGNOSIS — R739 Hyperglycemia, unspecified: Secondary | ICD-10-CM

## 2021-07-17 LAB — LIPID PANEL
Chol/HDL Ratio: 3.7 ratio (ref 0.0–5.0)
Cholesterol, Total: 190 mg/dL (ref 100–199)
HDL: 51 mg/dL (ref >39)
LDL Chol Calc (NIH): 100 mg/dL — ABNORMAL HIGH (ref 0–99)
Triglycerides: 231 mg/dL — ABNORMAL HIGH (ref 0–149)
VLDL Cholesterol Cal: 39 mg/dL (ref 5–40)

## 2021-07-17 LAB — CBC WITH DIFFERENTIAL/PLATELET
Basophils Absolute: 0.1 10*3/uL (ref 0.0–0.2)
Basos: 1 %
EOS (ABSOLUTE): 0.2 10*3/uL (ref 0.0–0.4)
Eos: 3 %
Hematocrit: 41.1 % (ref 37.5–51.0)
Hemoglobin: 14.1 g/dL (ref 13.0–17.7)
Immature Grans (Abs): 0 10*3/uL (ref 0.0–0.1)
Immature Granulocytes: 0 %
Lymphocytes Absolute: 1.3 10*3/uL (ref 0.7–3.1)
Lymphs: 17 %
MCH: 31.6 pg (ref 26.6–33.0)
MCHC: 34.3 g/dL (ref 31.5–35.7)
MCV: 92 fL (ref 79–97)
Monocytes Absolute: 0.5 10*3/uL (ref 0.1–0.9)
Monocytes: 6 %
Neutrophils Absolute: 5.7 10*3/uL (ref 1.4–7.0)
Neutrophils: 73 %
Platelets: 221 10*3/uL (ref 150–450)
RBC: 4.46 x10E6/uL (ref 4.14–5.80)
RDW: 12.9 % (ref 11.6–15.4)
WBC: 7.9 10*3/uL (ref 3.4–10.8)

## 2021-07-17 LAB — TSH: TSH: 0.946 u[IU]/mL (ref 0.450–4.500)

## 2021-07-17 LAB — COMPREHENSIVE METABOLIC PANEL
ALT: 29 IU/L (ref 0–44)
AST: 18 IU/L (ref 0–40)
Albumin/Globulin Ratio: 1.8 (ref 1.2–2.2)
Albumin: 4.4 g/dL (ref 3.8–4.8)
Alkaline Phosphatase: 68 IU/L (ref 44–121)
BUN/Creatinine Ratio: 17 (ref 10–24)
BUN: 15 mg/dL (ref 8–27)
Bilirubin Total: 0.4 mg/dL (ref 0.0–1.2)
CO2: 19 mmol/L — ABNORMAL LOW (ref 20–29)
Calcium: 9.4 mg/dL (ref 8.6–10.2)
Chloride: 102 mmol/L (ref 96–106)
Creatinine, Ser: 0.86 mg/dL (ref 0.76–1.27)
Globulin, Total: 2.4 g/dL (ref 1.5–4.5)
Glucose: 190 mg/dL — ABNORMAL HIGH (ref 70–99)
Potassium: 4.2 mmol/L (ref 3.5–5.2)
Sodium: 138 mmol/L (ref 134–144)
Total Protein: 6.8 g/dL (ref 6.0–8.5)
eGFR: 95 mL/min/{1.73_m2} (ref >59)

## 2021-07-17 LAB — PSA: Prostate Specific Ag, Serum: 3.4 ng/mL (ref 0.0–4.0)

## 2021-07-17 LAB — CARDIOVASCULAR RISK ASSESSMENT

## 2021-07-20 ENCOUNTER — Other Ambulatory Visit: Payer: Self-pay

## 2021-07-20 LAB — HGB A1C W/O EAG: Hgb A1c MFr Bld: 7.4 % — ABNORMAL HIGH (ref 4.8–5.6)

## 2021-07-20 LAB — SPECIMEN STATUS REPORT

## 2021-07-20 MED ORDER — METFORMIN HCL 500 MG PO TABS: 500.0000 mg | ORAL_TABLET | Freq: Two times a day (BID) | ORAL | 2 refills | Status: DC

## 2021-07-26 ENCOUNTER — Other Ambulatory Visit: Payer: Self-pay | Admitting: Physician Assistant

## 2021-08-26 ENCOUNTER — Other Ambulatory Visit: Payer: Self-pay | Admitting: Physician Assistant

## 2021-09-02 ENCOUNTER — Other Ambulatory Visit: Payer: Self-pay | Admitting: Physician Assistant

## 2021-09-22 ENCOUNTER — Telehealth: Payer: Self-pay

## 2021-09-22 ENCOUNTER — Other Ambulatory Visit: Payer: Self-pay | Admitting: Family Medicine

## 2021-09-22 ENCOUNTER — Other Ambulatory Visit: Payer: Self-pay | Admitting: Physician Assistant

## 2021-09-22 NOTE — Telephone Encounter (Signed)
Patient received letter in the mail from insurance stating that they will no longer pay for Vascepa. Patient is currently taking fish oil OTC until medication is changed or PA is done for medication. Please advise.

## 2021-09-22 NOTE — Telephone Encounter (Signed)
Noted  

## 2021-11-03 ENCOUNTER — Other Ambulatory Visit: Payer: Self-pay

## 2021-11-03 MED ORDER — ROSUVASTATIN CALCIUM 5 MG PO TABS: ORAL_TABLET | ORAL | 0 refills | Status: DC

## 2021-11-10 ENCOUNTER — Other Ambulatory Visit: Payer: Self-pay

## 2021-11-10 MED ORDER — METFORMIN HCL 500 MG PO TABS: ORAL_TABLET | ORAL | 0 refills | Status: DC

## 2021-12-14 ENCOUNTER — Ambulatory Visit (INDEPENDENT_AMBULATORY_CARE_PROVIDER_SITE_OTHER): Payer: PPO | Admitting: Physician Assistant

## 2021-12-14 ENCOUNTER — Encounter: Payer: Self-pay | Admitting: Physician Assistant

## 2021-12-14 VITALS — BP 130/82 | HR 97 | Temp 97.2°F | Ht 68.5 in | Wt 225.6 lb

## 2021-12-14 DIAGNOSIS — L237 Allergic contact dermatitis due to plants, except food: Secondary | ICD-10-CM

## 2021-12-14 MED ORDER — TRIAMCINOLONE ACETONIDE 40 MG/ML IJ SUSP
80.0000 mg | Freq: Once | INTRAMUSCULAR | Status: AC
  Administered 2021-12-14: 80 mg via INTRAMUSCULAR

## 2021-12-14 MED ORDER — PREDNISONE 20 MG PO TABS: ORAL_TABLET | ORAL | 0 refills | Status: AC

## 2021-12-14 NOTE — Progress Notes (Signed)
Acute Office Visit  Subjective:    Patient ID: Maxwell Williams, male    DOB: [birth year], 66 y.o.   MRN: [id number]  Chief Complaint  Patient presents with   Rash    HPI: Patient is in today for complaints of rash on arms, abdomen and legs that started last week after clearing brush.  Is very pruritic and taking benadryl that helps with symptoms Seems to be spreading  Past Medical History:  Diagnosis Date   Bilateral carpal tunnel syndrome 09/22/2015   Hypertension    Mixed hyperlipidemia 09/22/2015   Unilateral primary osteoarthritis, left knee 07/31/2019   Unilateral primary osteoarthritis, right knee 07/24/2020    Past Surgical History:  Procedure Laterality Date   KNEE SURGERY Left    WISDOM TOOTH EXTRACTION     wisom      Family History  Problem Relation Age of Onset   COPD Mother    Breast cancer Mother    Ovarian cancer Mother    Stroke Father    Prostate cancer Brother     Social History   Socioeconomic History   Marital status: Single    Spouse name: Not on file   Number of children: Not on file   Years of education: Not on file   Highest education level: Not on file  Occupational History   Not on file  Tobacco Use   Smoking status: Former    Types: Cigarettes    Quit date: 1996    Years since quitting: 27.7   Smokeless tobacco: Never  Vaping Use   Vaping Use: Never used  Substance and Sexual Activity   Alcohol use: Yes    Alcohol/week: 2.0 standard drinks of alcohol    Types: 2 Glasses of wine per week    Comment: ocassionally   Drug use: No   Sexual activity: Not Currently  Other Topics Concern   Not on file  Social History Narrative   Not on file   Social Determinants of Health   Financial Resource Strain: Not on file  Food Insecurity: Not on file  Transportation Needs: Not on file  Physical Activity: Not on file  Stress: Not on file  Social Connections: Not on file  Intimate Partner Violence: Not on file    Outpatient  Medications Prior to Visit  Medication Sig Dispense Refill   busPIRone (BUSPAR) 10 MG tablet TAKE 1 TABLET(10 MG) BY MOUTH TWICE DAILY 180 tablet 1   diclofenac Sodium (VOLTAREN) 1 % GEL Apply to affected areas bid as needed 100 g 2   lisinopril (ZESTRIL) 40 MG tablet TAKE 1 TABLET(40 MG) BY MOUTH DAILY 90 tablet 1   metFORMIN (GLUCOPHAGE) 500 MG tablet TAKE 1 TABLET(500 MG) BY MOUTH TWICE DAILY WITH A MEAL 180 tablet 0   Omega-3 Fatty Acids (FISH OIL) 1000 MG CAPS Take by mouth.     rosuvastatin (CRESTOR) 5 MG tablet TAKE 1 TABLET(5 MG) BY MOUTH DAILY 90 tablet 0   cephALEXin (KEFLEX) 500 MG capsule Take 1 capsule (500 mg total) by mouth 2 (two) times daily. 14 capsule 0   icosapent Ethyl (VASCEPA) 1 g capsule TAKE 2 CAPSULES(2 GRAMS) BY MOUTH TWICE DAILY 180 capsule 1   No facility-administered medications prior to visit.    No Known Allergies  Review of Systems CONSTITUTIONAL: Negative for chills, fatigue, fever,  E/N/T: Negative for ear pain, nasal congestion and sore throat.   RESPIRATORY: Negative for recent cough and dyspnea.   INTEGUMENTARY: see HPI  Objective:  PHYSICAL EXAM:   VS: BP 130/82 (BP Location: Left Arm, Patient Position: Sitting, Cuff Size: Large)   Pulse 97   Temp (!) 97.2 F (36.2 C) (Temporal)   Ht 5' 8.5" (1.74 m)   Wt 225 lb 9.6 oz (102.3 kg)   SpO2 98%   BMI 33.80 kg/m   GEN: Well nourished, well developed, in no acute distress  Cardiac: RRR; no murmurs, Respiratory:  normal respiratory rate and pattern with no distress - normal breath sounds with no rales, rhonchi, wheezes or rubs Skin: erythmatous/blanchable lesions noted on lower legs, upper arms and abdomen     Health Maintenance Due  Topic Date Due   Diabetic kidney evaluation - Urine ACR  Never done    There are no preventive care reminders to display for this patient.   Lab Results  Component Value Date   TSH 0.946 07/16/2021   Lab Results  Component Value Date   WBC  7.9 07/16/2021   HGB 14.1 07/16/2021   HCT 41.1 07/16/2021   MCV 92 07/16/2021   PLT 221 07/16/2021   Lab Results  Component Value Date   NA 138 07/16/2021   K 4.2 07/16/2021   CO2 19 (L) 07/16/2021   GLUCOSE 190 (H) 07/16/2021   BUN 15 07/16/2021   CREATININE 0.86 07/16/2021   BILITOT 0.4 07/16/2021   ALKPHOS 68 07/16/2021   AST 18 07/16/2021   ALT 29 07/16/2021   PROT 6.8 07/16/2021   ALBUMIN 4.4 07/16/2021   CALCIUM 9.4 07/16/2021   EGFR 95 07/16/2021   Lab Results  Component Value Date   CHOL 190 07/16/2021   Lab Results  Component Value Date   HDL 51 07/16/2021   Lab Results  Component Value Date   LDLCALC 100 (H) 07/16/2021   Lab Results  Component Value Date   TRIG 231 (H) 07/16/2021   Lab Results  Component Value Date   CHOLHDL 3.7 07/16/2021   Lab Results  Component Value Date   HGBA1C 7.4 (H) 07/16/2021       Assessment & Plan:   Problem List Items Addressed This Visit   None Visit Diagnoses     Allergic contact dermatitis due to plants, except food    -  Primary   Relevant Medications   triamcinolone acetonide (KENALOG-40) injection 80 mg (Start on 12/14/2021 11:15 AM)   predniSONE (DELTASONE) 20 MG tablet      Meds ordered this encounter  Medications   triamcinolone acetonide (KENALOG-40) injection 80 mg   predniSONE (DELTASONE) 20 MG tablet    Sig: Take 3 tablets (60 mg total) by mouth daily with breakfast for 3 days, THEN 2 tablets (40 mg total) daily with breakfast for 3 days, THEN 1 tablet (20 mg total) daily with breakfast for 3 days.    Dispense:  18 tablet    Refill:  0    Order Specific Question:   Supervising Provider    Answer:   Shelton Silvas    No orders of the defined types were placed in this encounter.    Follow-up: Return if symptoms worsen or fail to improve.  An After Visit Summary was printed and given to the patient.  Yetta Flock [facility] [phone]

## 2022-01-20 ENCOUNTER — Encounter: Payer: Self-pay | Admitting: Physician Assistant

## 2022-01-20 ENCOUNTER — Ambulatory Visit (INDEPENDENT_AMBULATORY_CARE_PROVIDER_SITE_OTHER): Payer: PPO | Admitting: Physician Assistant

## 2022-01-20 VITALS — BP 142/92 | HR 72 | Temp 97.4°F | Ht 68.5 in | Wt 222.0 lb

## 2022-01-20 DIAGNOSIS — E782 Mixed hyperlipidemia: Secondary | ICD-10-CM

## 2022-01-20 DIAGNOSIS — I10 Essential (primary) hypertension: Secondary | ICD-10-CM | POA: Diagnosis not present

## 2022-01-20 DIAGNOSIS — M159 Polyosteoarthritis, unspecified: Secondary | ICD-10-CM | POA: Diagnosis not present

## 2022-01-20 DIAGNOSIS — E119 Type 2 diabetes mellitus without complications: Secondary | ICD-10-CM

## 2022-01-20 MED ORDER — HYDROCHLOROTHIAZIDE 25 MG PO TABS: 25.0000 mg | ORAL_TABLET | Freq: Every day | ORAL | 1 refills | Status: DC

## 2022-01-20 NOTE — Progress Notes (Signed)
Subjective:  Patient ID: Maxwell Williams, male    DOB: 1955-08-05  Age: 66 y.o. MRN: [id number]  Chief Complaint  Patient presents with   Diabetes    HPI  Pt presents for follow up of hypertension.  The patient is tolerating the medication well without side effects. Compliance with treatment has been good; including taking medication as directed , maintains a healthy diet and regular exercise regimen , and following up as directed. Currently taking zestril 40mg  qd -- bp elevated today and has been borderline elevated in past  Mixed hyperlipidemia  Pt presents with hyperlipidemia.  Compliance with treatment has been good The patient is compliant with medications, maintains a low cholesterol diet , follows up as directed , and maintains an exercise regimen . The patient denies experiencing any hypercholesterolemia related symptoms. Currently on crestor 5mg  qd and fish oil  Pt with diabetes - currently on glucophage 500mg  - tolerating medication well Does not check glucose at home  Pt with history of anxiety - stable on buspar 10mg   Pt however has been drinking more wine because it helps with OA pain. He has not been using voltaren gel for knee pain and advised to utilize that medication and decrease alcohol use   Current Outpatient Medications on File Prior to Visit  Medication Sig Dispense Refill   busPIRone (BUSPAR) 10 MG tablet TAKE 1 TABLET(10 MG) BY MOUTH TWICE DAILY 180 tablet 1   diclofenac Sodium (VOLTAREN) 1 % GEL Apply to affected areas bid as needed 100 g 2   lisinopril (ZESTRIL) 40 MG tablet TAKE 1 TABLET(40 MG) BY MOUTH DAILY 90 tablet 1   metFORMIN (GLUCOPHAGE) 500 MG tablet TAKE 1 TABLET(500 MG) BY MOUTH TWICE DAILY WITH A MEAL 180 tablet 0   Omega-3 Fatty Acids (FISH OIL) 1000 MG CAPS Take by mouth.     rosuvastatin (CRESTOR) 5 MG tablet TAKE 1 TABLET(5 MG) BY MOUTH DAILY 90 tablet 0   No current facility-administered medications on file prior to visit.   Past Medical  History:  Diagnosis Date   Bilateral carpal tunnel syndrome 09/22/2015   Hypertension    Mixed hyperlipidemia 09/22/2015   Unilateral primary osteoarthritis, left knee 07/31/2019   Unilateral primary osteoarthritis, right knee 07/24/2020   Past Surgical History:  Procedure Laterality Date   KNEE SURGERY Left    WISDOM TOOTH EXTRACTION     wisom      Family History  Problem Relation Age of Onset   COPD Mother    Breast cancer Mother    Ovarian cancer Mother    Stroke Father    Prostate cancer Brother    Social History   Socioeconomic History   Marital status: Single    Spouse name: Not on file   Number of children: Not on file   Years of education: Not on file   Highest education level: Not on file  Occupational History   Not on file  Tobacco Use   Smoking status: Former    Types: Cigarettes    Quit date: 1996    Years since quitting: 27.8   Smokeless tobacco: Never  Vaping Use   Vaping Use: Never used  Substance and Sexual Activity   Alcohol use: Yes    Alcohol/week: 2.0 standard drinks of alcohol    Types: 2 Glasses of wine per week    Comment: ocassionally   Drug use: No   Sexual activity: Not Currently  Other Topics Concern   Not on file  Social History Narrative   Not on file   Social Determinants of Health   Financial Resource Strain: Not on file  Food Insecurity: Not on file  Transportation Needs: Not on file  Physical Activity: Not on file  Stress: Not on file  Social Connections: Not on file    Review of Systems  CONSTITUTIONAL: Negative for chills, fatigue, fever, unintentional weight gain and unintentional weight loss.  E/N/T: Negative for ear pain, nasal congestion and sore throat.  CARDIOVASCULAR: Negative for chest pain, dizziness, palpitations and pedal edema.  RESPIRATORY: Negative for recent cough and dyspnea.  GASTROINTESTINAL: Negative for abdominal pain, acid reflux symptoms, constipation, diarrhea, nausea and vomiting.  MSK: pain in  both knees - pt due for follow up with ortho to discuss options INTEGUMENTARY: Negative for rash.  NEUROLOGICAL: Negative for dizziness and headaches.  PSYCHIATRIC: Negative for sleep disturbance and to question depression screen.  Negative for depression, negative for anhedonia.      Objective:  PHYSICAL EXAM:   VS: BP (!) 142/92 (BP Location: Left Arm, Patient Position: Sitting, Cuff Size: Large)   Pulse 72   Temp (!) 97.4 F (36.3 C) (Temporal)   Ht 5' 8.5" (1.74 m)   Wt 222 lb (100.7 kg)   SpO2 99%   BMI 33.26 kg/m   GEN: Well nourished, well developed, in no acute distress  Cardiac: RRR; no murmurs, rubs, or gallops,no edema -  Respiratory:  normal respiratory rate and pattern with no distress - normal breath sounds with no rales, rhonchi, wheezes or rubs GI: normal bowel sounds, no masses or tenderness MS: no deformity or atrophy - walks slightly staggered because of knee pain Skin: warm and dry, no rash  Psych: euthymic mood, appropriate affect and demeanor  Lab Results  Component Value Date   WBC 7.9 07/16/2021   HGB 14.1 07/16/2021   HCT 41.1 07/16/2021   PLT 221 07/16/2021   GLUCOSE 190 (H) 07/16/2021   CHOL 190 07/16/2021   TRIG 231 (H) 07/16/2021   HDL 51 07/16/2021   LDLCALC 100 (H) 07/16/2021   ALT 29 07/16/2021   AST 18 07/16/2021   NA 138 07/16/2021   K 4.2 07/16/2021   CL 102 07/16/2021   CREATININE 0.86 07/16/2021   BUN 15 07/16/2021   CO2 19 (L) 07/16/2021   TSH 0.946 07/16/2021   HGBA1C 7.4 (H) 07/16/2021      Assessment & Plan:   Problem List Items Addressed This Visit       Cardiovascular and Mediastinum   Essential hypertension - Primary   Relevant Medications   hydrochlorothiazide (HYDRODIURIL) 25 MG tablet Continue lisinopril 40mg  qd Decrease ETOH Bp recheck with nurse 3 weeks   Other Relevant Orders   CBC with Differential/Platelet   Comprehensive metabolic panel     Musculoskeletal and Integument   Generalized OA Use  voltaren gel as directed Follow up with ortho     Other   Mixed hyperlipidemia   Relevant Medications   hydrochlorothiazide (HYDRODIURIL) 25 MG tablet   Other Relevant Orders   Lipid panel Continue crestor and fish oil   Other Visit Diagnoses     Non-insulin dependent type 2 diabetes mellitus (HCC)       Relevant Orders   Hemoglobin A1c Continue glucophage     .  Meds ordered this encounter  Medications   hydrochlorothiazide (HYDRODIURIL) 25 MG tablet    Sig: Take 1 tablet (25 mg total) by mouth daily.    Dispense:  90  tablet    Refill:  1    Order Specific Question:   Supervising Provider    AnswerCorey Harold    Orders Placed This Encounter  Procedures   CBC with Differential/Platelet   Comprehensive metabolic panel   Lipid panel   Hemoglobin A1c     Follow-up: Return 3 weeks bp check nurse visit --- [facility] wellness with Selena Batten before end of year ---.  An After Visit Summary was printed and given to the patient.  Jettie Pagan [facility] [phone]

## 2022-01-21 LAB — COMPREHENSIVE METABOLIC PANEL
ALT: 17 IU/L (ref 0–44)
AST: 16 IU/L (ref 0–40)
Albumin/Globulin Ratio: 2.1 (ref 1.2–2.2)
Albumin: 4.8 g/dL (ref 3.9–4.9)
Alkaline Phosphatase: 60 IU/L (ref 44–121)
BUN/Creatinine Ratio: 17 (ref 10–24)
BUN: 15 mg/dL (ref 8–27)
Bilirubin Total: 0.6 mg/dL (ref 0.0–1.2)
CO2: 23 mmol/L (ref 20–29)
Calcium: 9.9 mg/dL (ref 8.6–10.2)
Chloride: 100 mmol/L (ref 96–106)
Creatinine, Ser: 0.87 mg/dL (ref 0.76–1.27)
Globulin, Total: 2.3 g/dL (ref 1.5–4.5)
Glucose: 146 mg/dL — ABNORMAL HIGH (ref 70–99)
Potassium: 4.2 mmol/L (ref 3.5–5.2)
Sodium: 142 mmol/L (ref 134–144)
Total Protein: 7.1 g/dL (ref 6.0–8.5)
eGFR: 95 mL/min/{1.73_m2} (ref >59)

## 2022-01-21 LAB — CBC WITH DIFFERENTIAL/PLATELET
Basophils Absolute: 0.1 10*3/uL (ref 0.0–0.2)
Basos: 1 %
EOS (ABSOLUTE): 0.3 10*3/uL (ref 0.0–0.4)
Eos: 4 %
Hematocrit: 42.8 % (ref 37.5–51.0)
Hemoglobin: 14.5 g/dL (ref 13.0–17.7)
Immature Grans (Abs): 0 10*3/uL (ref 0.0–0.1)
Immature Granulocytes: 0 %
Lymphocytes Absolute: 1.1 10*3/uL (ref 0.7–3.1)
Lymphs: 15 %
MCH: 31.2 pg (ref 26.6–33.0)
MCHC: 33.9 g/dL (ref 31.5–35.7)
MCV: 92 fL (ref 79–97)
Monocytes Absolute: 0.5 10*3/uL (ref 0.1–0.9)
Monocytes: 8 %
Neutrophils Absolute: 4.9 10*3/uL (ref 1.4–7.0)
Neutrophils: 72 %
Platelets: 224 10*3/uL (ref 150–450)
RBC: 4.65 x10E6/uL (ref 4.14–5.80)
RDW: 12.9 % (ref 11.6–15.4)
WBC: 6.8 10*3/uL (ref 3.4–10.8)

## 2022-01-21 LAB — HEMOGLOBIN A1C
Est. average glucose Bld gHb Est-mCnc: 134 mg/dL
Hgb A1c MFr Bld: 6.3 % — ABNORMAL HIGH (ref 4.8–5.6)

## 2022-01-21 LAB — LIPID PANEL
Chol/HDL Ratio: 3.5 ratio (ref 0.0–5.0)
Cholesterol, Total: 178 mg/dL (ref 100–199)
HDL: 51 mg/dL (ref >39)
LDL Chol Calc (NIH): 87 mg/dL (ref 0–99)
Triglycerides: 244 mg/dL — ABNORMAL HIGH (ref 0–149)
VLDL Cholesterol Cal: 40 mg/dL (ref 5–40)

## 2022-01-21 LAB — CARDIOVASCULAR RISK ASSESSMENT

## 2022-01-22 ENCOUNTER — Other Ambulatory Visit: Payer: Self-pay | Admitting: Physician Assistant

## 2022-01-25 ENCOUNTER — Other Ambulatory Visit: Payer: Self-pay

## 2022-01-25 MED ORDER — LISINOPRIL 40 MG PO TABS: ORAL_TABLET | ORAL | 1 refills | Status: DC

## 2022-02-03 ENCOUNTER — Ambulatory Visit (INDEPENDENT_AMBULATORY_CARE_PROVIDER_SITE_OTHER): Payer: PPO

## 2022-02-03 ENCOUNTER — Other Ambulatory Visit: Payer: Self-pay

## 2022-02-03 VITALS — BP 152/80 | HR 81 | Resp 16 | Ht 68.5 in | Wt 226.2 lb

## 2022-02-03 DIAGNOSIS — Z0000 Encounter for general adult medical examination without abnormal findings: Secondary | ICD-10-CM | POA: Diagnosis not present

## 2022-02-03 MED ORDER — ROSUVASTATIN CALCIUM 5 MG PO TABS: ORAL_TABLET | ORAL | 0 refills | Status: DC

## 2022-02-03 MED ORDER — LISINOPRIL 40 MG PO TABS: ORAL_TABLET | ORAL | 1 refills | Status: DC

## 2022-02-03 NOTE — Progress Notes (Signed)
Subjective:   Maxwell Williams is a 66 y.o. male who presents for an Initial Medicare Annual Wellness Visit.  This wellness visit is conducted by a nurse.  The patient's medications were reviewed and reconciled since the patient's last visit.  History details were provided by the patient.  The history appears to be reliable.    Medical History: Patient history and Family history was reviewed  Medications, Allergies, and preventative health maintenance was reviewed and updated.  Cardiac Risk Factors include: advanced age (>32men, >30 women);diabetes mellitus;dyslipidemia;obesity (BMI >30kg/m2);male gender;hypertension     Objective:    Today's Vitals   02/03/22 1031 02/03/22 1035  BP: (!) 160/80 (!) 152/80  Pulse: 81   Resp: 16   SpO2: 97%   Weight: 226 lb 3.2 oz (102.6 kg)   Height: 5' 8.5" (1.74 m)   PainSc:  4    Body mass index is 33.89 kg/m.     02/03/2022   10:39 AM 08/14/2020    7:24 PM  Advanced Directives  Does Patient Have a Medical Advance Directive? No No  Would patient like information on creating a medical advance directive? Yes (MAU/Ambulatory/Procedural Areas - Information given) Yes (MAU/Ambulatory/Procedural Areas - Information given)    Current Medications (verified) Outpatient Encounter Medications as of 02/03/2022  Medication Sig   busPIRone (BUSPAR) 10 MG tablet TAKE 1 TABLET(10 MG) BY MOUTH TWICE DAILY   diclofenac Sodium (VOLTAREN) 1 % GEL Apply to affected areas bid as needed   hydrochlorothiazide (HYDRODIURIL) 25 MG tablet Take 1 tablet (25 mg total) by mouth daily.   lisinopril (ZESTRIL) 40 MG tablet TAKE 1 TABLET(40 MG) BY MOUTH DAILY   metFORMIN (GLUCOPHAGE) 500 MG tablet TAKE 1 TABLET(500 MG) BY MOUTH TWICE DAILY WITH A MEAL   Omega-3 Fatty Acids (FISH OIL) 1000 MG CAPS Take by mouth.   rosuvastatin (CRESTOR) 5 MG tablet TAKE 1 TABLET(5 MG) BY MOUTH DAILY   No facility-administered encounter medications on file as of 02/03/2022.     Allergies (verified) Patient has no known allergies.   History: Past Medical History:  Diagnosis Date   Bilateral carpal tunnel syndrome 09/22/2015   Hypertension    Mixed hyperlipidemia 09/22/2015   Unilateral primary osteoarthritis, left knee 07/31/2019   Unilateral primary osteoarthritis, right knee 07/24/2020   Past Surgical History:  Procedure Laterality Date   KNEE SURGERY Left    WISDOM TOOTH EXTRACTION     Family History  Problem Relation Age of Onset   COPD Mother    Breast cancer Mother    Ovarian cancer Mother    Stroke Father    Prostate cancer Brother    Diabetes Brother    Social History   Socioeconomic History   Marital status: Single    Spouse name: Not on file   Number of children: 0   Years of education: Not on file   Highest education level: Not on file  Occupational History   Occupation: Retired  Tobacco Use   Smoking status: Former    Types: Cigarettes    Quit date: 1996    Years since quitting: 27.9   Smokeless tobacco: Never  Vaping Use   Vaping Use: Never used  Substance and Sexual Activity   Alcohol use: Not Currently    Alcohol/week: 2.0 standard drinks of alcohol    Types: 2 Glasses of wine per week    Comment: Stopped drinking wine   Drug use: No   Sexual activity: Not Currently  Other Topics Concern  Not on file  Social History Narrative   Not on file   Social Determinants of Health   Financial Resource Strain: Low Risk  (02/02/2022)   Overall Financial Resource Strain (CARDIA)    Difficulty of Paying Living Expenses: Not very hard  Food Insecurity: No Food Insecurity (02/02/2022)   Hunger Vital Sign    Worried About Running Out of Food in the Last Year: Never true    Ran Out of Food in the Last Year: Never true  Transportation Needs: No Transportation Needs (02/02/2022)   PRAPARE - Administrator, Civil Service (Medical): No    Lack of Transportation (Non-Medical): No  Physical Activity: Sufficiently  Active (02/02/2022)   Exercise Vital Sign    Days of Exercise per Week: 3 days    Minutes of Exercise per Session: 90 min  Stress: No Stress Concern Present (02/02/2022)   [employer] of Occupational Health - Occupational Stress Questionnaire    Feeling of Stress : Only a little  Social Connections: Unknown (02/02/2022)   Social Connection and Isolation Panel [NHANES]    Frequency of Communication with Friends and Family: More than three times a week    Frequency of Social Gatherings with Friends and Family: Twice a week    Attends Religious Services: Patient refused    Database administrator or Organizations: No    Attends Engineer, structural: Never    Marital Status: Never married    Tobacco Counseling Counseling given: Not Answered   Clinical Intake:  Pre-visit preparation completed: Yes Pain : 0-10 Pain Score: 4  Pain Location: Knee Pain Orientation: Other (Comment) (Bilateral) Pain Frequency: Constant   BMI - recorded: 33.89 Nutritional Status: BMI > 30  Obese Nutritional Risks: None Diabetes: Yes (most recent A1C 6.3) How often do you need to have someone help you when you read instructions, pamphlets, or other written materials from your doctor or pharmacy?: 1 - Never Interpreter Needed?: No    Activities of Daily Living    02/02/2022   10:57 AM  In your present state of health, do you have any difficulty performing the following activities:  Hearing? 0  Vision? 0  Difficulty concentrating or making decisions? 0  Walking or climbing stairs? 1  Dressing or bathing? 0  Doing errands, shopping? 0  Preparing Food and eating ? N  Using the Toilet? N  In the past six months, have you accidently leaked urine? N  Do you have problems with loss of bowel control? N  Managing your Medications? N  Managing your Finances? N  Housekeeping or managing your Housekeeping? N    Patient Care Team: Marianne Sofia, Cordelia Poche as PCP - General (Physician  Assistant)     Assessment:   This is a routine wellness examination for Maxwell Williams.  Hearing/Vision screen No results found.  Dietary issues and exercise activities discussed: Current Exercise Habits: Home exercise routine, Type of exercise: walking;stretching, Time (Minutes): 30, Frequency (Times/Week): 3, Weekly Exercise (Minutes/Week): 90, Intensity: Mild, Exercise limited by: orthopedic condition(s)   Goals Addressed             This Visit's Progress    Weight (lb) < 200 lb (90.7 kg)   226 lb 3.2 oz (102.6 kg)      Depression Screen    02/03/2022   10:37 AM 01/20/2022    8:18 AM 08/14/2020    7:24 PM 06/26/2020    3:50 PM  PHQ 2/9 Scores  PHQ - 2 Score  1 1 0 0  PHQ- 9 Score 1 1      Fall Risk    02/02/2022   10:57 AM 01/20/2022    8:18 AM 12/14/2021   10:53 AM 08/14/2020    7:24 PM  Fall Risk   Falls in the past year? 0 0 0 0  Number falls in past yr: 0 0 0   Injury with Fall? 0 0 0   Risk for fall due to : No Fall Risks No Fall Risks No Fall Risks   Follow up Falls evaluation completed;Education provided Falls evaluation completed Falls evaluation completed;Falls prevention discussed     FALL RISK PREVENTION PERTAINING TO THE HOME:  Any stairs in or around the home? Yes  If so, are there any without handrails? No  Home free of loose throw rugs in walkways, pet beds, electrical cords, etc? Yes  Adequate lighting in your home to reduce risk of falls? Yes   ASSISTIVE DEVICES UTILIZED TO PREVENT FALLS:  Life alert? No  Use of a cane, walker or w/c? No  Gait slow and steady without use of assistive device  Cognitive Function:        02/03/2022   10:41 AM  6CIT Screen  What Year? 0 points  What month? 0 points  What time? 0 points  Count back from 20 0 points  Months in reverse 0 points  Repeat phrase 0 points  Total Score 0 points    Immunizations Immunization History  Administered Date(s) Administered   Moderna Sars-Covid-2 Vaccination 06/16/2019,  07/14/2019    TDAP status: Due, Education has been provided regarding the importance of this vaccine. Advised may receive this vaccine at local pharmacy or Health Dept. Aware to provide a copy of the vaccination record if obtained from local pharmacy or Health Dept. Verbalized acceptance and understanding.  Flu Vaccine status: Declined, Education has been provided regarding the importance of this vaccine but patient still declined. Advised may receive this vaccine at local pharmacy or Health Dept. Aware to provide a copy of the vaccination record if obtained from local pharmacy or Health Dept. Verbalized acceptance and understanding.  Pneumococcal vaccine status: Declined,  Education has been provided regarding the importance of this vaccine but patient still declined. Advised may receive this vaccine at local pharmacy or Health Dept. Aware to provide a copy of the vaccination record if obtained from local pharmacy or Health Dept. Verbalized acceptance and understanding.   Covid-19 vaccine status: Declined, Education has been provided regarding the importance of this vaccine but patient still declined. Advised may receive this vaccine at local pharmacy or Health Dept.or vaccine clinic. Aware to provide a copy of the vaccination record if obtained from local pharmacy or Health Dept. Verbalized acceptance and understanding.  Qualifies for Shingles Vaccine? Yes   Zostavax completed No   Shingrix Completed?: No.    Education has been provided regarding the importance of this vaccine. Patient has been advised to call insurance company to determine out of pocket expense if they have not yet received this vaccine. Advised may also receive vaccine at local pharmacy or Health Dept. Verbalized acceptance and understanding.  Screening Tests Health Maintenance  Topic Date Due   Medicare Annual Wellness (AWV)  Never done   Diabetic kidney evaluation - Urine ACR  04/26/2022 (Originally 01/09/1974)   INFLUENZA  VACCINE  06/06/2022 (Originally 10/06/2021)   Diabetic kidney evaluation - GFR measurement  01/21/2023   Fecal DNA (Cologuard)  01/28/2024   HPV VACCINES  Aged  Out   Pneumonia Vaccine 46+ Years old  Discontinued   COVID-19 Vaccine  Discontinued   Hepatitis C Screening  Discontinued   Zoster Vaccines- Shingrix  Discontinued    Health Maintenance  Health Maintenance Due  Topic Date Due   Medicare Annual Wellness (AWV)  Never done    Colorectal cancer screening: Type of screening: Cologuard. Completed 01/27/2021. Repeat every 3 years  Lung Cancer Screening: (Low Dose CT Chest recommended if Age 104-80 years, 30 pack-year currently smoking OR have quit w/in 15years.) does not qualify.   Lung Cancer Screening Referral: N/A  Additional Screening:  Vision Screening: Recommended annual ophthalmology exams for early detection of glaucoma and other disorders of the eye. Is the patient up to date with their annual eye exam?  Yes  If pt is not established with a provider, would they like to be referred to a provider to establish care?  N/A .   Dental Screening: Recommended annual dental exams for proper oral hygiene  Community Resource Referral / Chronic Care Management: CRR required this visit?  No   CCM required this visit?  No      Plan:    1- Patient declined all vaccines 2- Goal for weight loss   I have personally reviewed and noted the following in the patient's chart:   Medical and social history Use of alcohol, tobacco or illicit drugs  Current medications and supplements including opioid prescriptions. Patient is not currently taking opioid prescriptions. Functional ability and status Nutritional status Physical activity Advanced directives List of other physicians Hospitalizations, surgeries, and ER visits in previous 12 months Vitals Screenings to include cognitive, depression, and falls Referrals and appointments  In addition, I have reviewed and discussed with  patient certain preventive protocols, quality metrics, and best practice recommendations. A written personalized care plan for preventive services as well as general preventive health recommendations were provided to patient.     Erie Noe, LPN   [id number]

## 2022-02-03 NOTE — Patient Instructions (Signed)
Maxwell Williams , Thank you for taking time to come for your Medicare Wellness Visit. I appreciate your ongoing commitment to your health goals. Please review the following plan we discussed and let me know if I can assist you in the future.   Screening recommendations/referrals: Cologuard: Due 01/2024 Recommended yearly ophthalmology/optometry visit for glaucoma screening and checkup Recommended yearly dental visit for hygiene and checkup  Vaccinations: Influenza vaccine: Declined Pneumococcal vaccine: Declined Tdap vaccine: Declined Shingles vaccine: Declined    Advanced directives: Information given, please bring a copy for your medical record once completed   Preventive Care 65 Years and Older, Male Preventive care refers to lifestyle choices and visits with your health care provider that can promote health and wellness. What does preventive care include? A yearly physical exam. This is also called an annual well check. Dental exams once or twice a year. Routine eye exams. Ask your health care provider how often you should have your eyes checked. Personal lifestyle choices, including: Daily care of your teeth and gums. Regular physical activity. Eating a healthy diet. Avoiding tobacco and drug use. Limiting alcohol use. Practicing safe sex. Taking low doses of aspirin every day. Taking vitamin and mineral supplements as recommended by your health care provider. What happens during an annual well check? The services and screenings done by your health care provider during your annual well check will depend on your age, overall health, lifestyle risk factors, and family history of disease. Counseling  Your health care provider may ask you questions about your: Alcohol use. Tobacco use. Drug use. Emotional well-being. Home and relationship well-being. Sexual activity. Eating habits. History of falls. Memory and ability to understand (cognition). Work and work  Astronomer. Screening  You may have the following tests or measurements: Height, weight, and BMI. Blood pressure. Lipid and cholesterol levels. These may be checked every 5 years, or more frequently if you are over 35 years old. Skin check. Lung cancer screening. You may have this screening every year starting at age 62 if you have a 30-pack-year history of smoking and currently smoke or have quit within the past 15 years. Fecal occult blood test (FOBT) of the stool. You may have this test every year starting at age 31. Flexible sigmoidoscopy or colonoscopy. You may have a sigmoidoscopy every 5 years or a colonoscopy every 10 years starting at age 69. Prostate cancer screening. Recommendations will vary depending on your family history and other risks. Hepatitis C blood test. Hepatitis B blood test. Sexually transmitted disease (STD) testing. Diabetes screening. This is done by checking your blood sugar (glucose) after you have not eaten for a while (fasting). You may have this done every 1-3 years. Abdominal aortic aneurysm (AAA) screening. You may need this if you are a current or former smoker. Osteoporosis. You may be screened starting at age 60 if you are at high risk. Talk with your health care provider about your test results, treatment options, and if necessary, the need for more tests. Vaccines  Your health care provider may recommend certain vaccines, such as: Influenza vaccine. This is recommended every year. Tetanus, diphtheria, and acellular pertussis (Tdap, Td) vaccine. You may need a Td booster every 10 years. Zoster vaccine. You may need this after age 20. Pneumococcal 13-valent conjugate (PCV13) vaccine. One dose is recommended after age 11. Pneumococcal polysaccharide (PPSV23) vaccine. One dose is recommended after age 34. Talk to your health care provider about which screenings and vaccines you need and how often you need them.  This information is not intended to replace  advice given to you by your health care provider. Make sure you discuss any questions you have with your health care provider. Document Released: 03/21/2015 Document Revised: 11/12/2015 Document Reviewed: 12/24/2014 Elsevier Interactive Patient Education  2017 [employer] Prevention in the Home Falls can cause injuries. They can happen to people of all ages. There are many things you can do to make your home safe and to help prevent falls. What can I do on the outside of my home? Regularly fix the edges of walkways and driveways and fix any cracks. Remove anything that might make you trip as you walk through a door, such as a raised step or threshold. Trim any bushes or trees on the path to your home. Use bright outdoor lighting. Clear any walking paths of anything that might make someone trip, such as rocks or tools. Regularly check to see if handrails are loose or broken. Make sure that both sides of any steps have handrails. Any raised decks and porches should have guardrails on the edges. Have any leaves, snow, or ice cleared regularly. Use sand or salt on walking paths during winter. Clean up any spills in your garage right away. This includes oil or grease spills. What can I do in the bathroom? Use night lights. Install grab bars by the toilet and in the tub and shower. Do not use towel bars as grab bars. Use non-skid mats or decals in the tub or shower. If you need to sit down in the shower, use a plastic, non-slip stool. Keep the floor dry. Clean up any water that spills on the floor as soon as it happens. Remove soap buildup in the tub or shower regularly. Attach bath mats securely with double-sided non-slip rug tape. Do not have throw rugs and other things on the floor that can make you trip. What can I do in the bedroom? Use night lights. Make sure that you have a light by your bed that is easy to reach. Do not use any sheets or blankets that are too big for your bed.  They should not hang down onto the floor. Have a firm chair that has side arms. You can use this for support while you get dressed. Do not have throw rugs and other things on the floor that can make you trip. What can I do in the kitchen? Clean up any spills right away. Avoid walking on wet floors. Keep items that you use a lot in easy-to-reach places. If you need to reach something above you, use a strong step stool that has a grab bar. Keep electrical cords out of the way. Do not use floor polish or wax that makes floors slippery. If you must use wax, use non-skid floor wax. Do not have throw rugs and other things on the floor that can make you trip. What can I do with my stairs? Do not leave any items on the stairs. Make sure that there are handrails on both sides of the stairs and use them. Fix handrails that are broken or loose. Make sure that handrails are as long as the stairways. Check any carpeting to make sure that it is firmly attached to the stairs. Fix any carpet that is loose or worn. Avoid having throw rugs at the top or bottom of the stairs. If you do have throw rugs, attach them to the floor with carpet tape. Make sure that you have a light switch at the  top of the stairs and the bottom of the stairs. If you do not have them, ask someone to add them for you. What else can I do to help prevent falls? Wear shoes that: Do not have high heels. Have rubber bottoms. Are comfortable and fit you well. Are closed at the toe. Do not wear sandals. If you use a stepladder: Make sure that it is fully opened. Do not climb a closed stepladder. Make sure that both sides of the stepladder are locked into place. Ask someone to hold it for you, if possible. Clearly Jarron and make sure that you can see: Any grab bars or handrails. First and last steps. Where the edge of each step is. Use tools that help you move around (mobility aids) if they are needed. These  include: Canes. Walkers. Scooters. Crutches. Turn on the lights when you go into a dark area. Replace any light bulbs as soon as they burn out. Set up your furniture so you have a clear path. Avoid moving your furniture around. If any of your floors are uneven, fix them. If there are any pets around you, be aware of where they are. Review your medicines with your doctor. Some medicines can make you feel dizzy. This can increase your chance of falling. Ask your doctor what other things that you can do to help prevent falls. This information is not intended to replace advice given to you by your health care provider. Make sure you discuss any questions you have with your health care provider. Document Released: 12/19/2008 Document Revised: 07/31/2015 Document Reviewed: 03/29/2014 Elsevier Interactive Patient Education  2017 [employer].

## 2022-02-10 ENCOUNTER — Ambulatory Visit: Payer: PPO

## 2022-02-10 NOTE — Progress Notes (Signed)
Patient came in for a blood pressure check.    Provider made aware of blood pressure  Per Marianne Sofia no change in medicine at this time follow up as directed

## 2022-03-08 IMAGING — MR MR KNEE*R* W/O CM
4 of 7 series · 18 of 40 positions shown · non-contrast
Comparison: None.

CLINICAL DATA: Chronic right knee pain.

EXAM:
MRI OF THE RIGHT KNEE WITHOUT CONTRAST
TECHNIQUE: Multiplanar, multisequence MR imaging of the knee was performed. No
intravenous contrast was administered.

[Series 3: T2 fat-sat · axial · 4.0mm · 0.29mm/px · z∈[-37,+60]mm · 3 of 28 slices shown (1 of 2)]
[im 6/28]
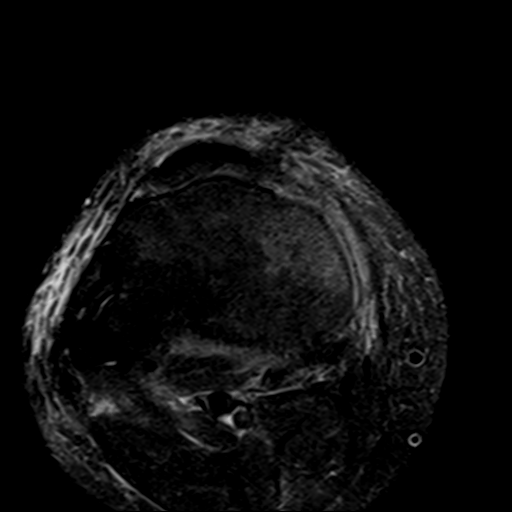
[im 17/28]
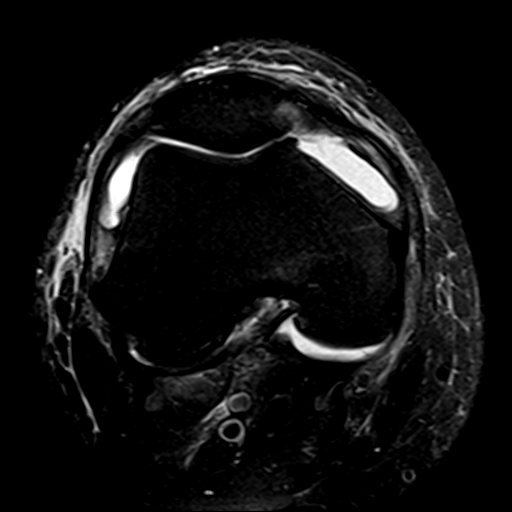
[im 28/28]
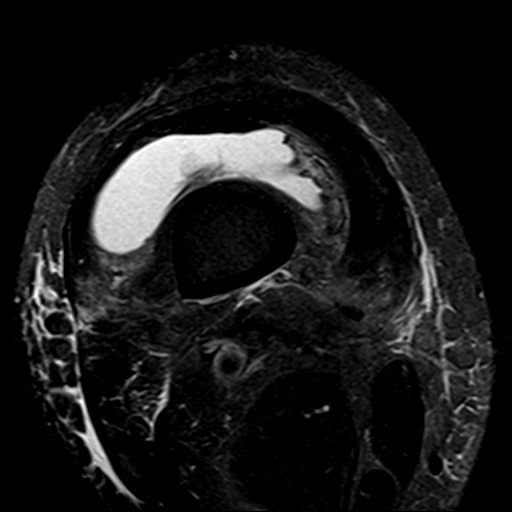

[Series 5: T2 fat-sat · coronal · 4.0mm · 0.29mm/px · 3 of 26 slices shown (2 of 2)]
[im 1/26]
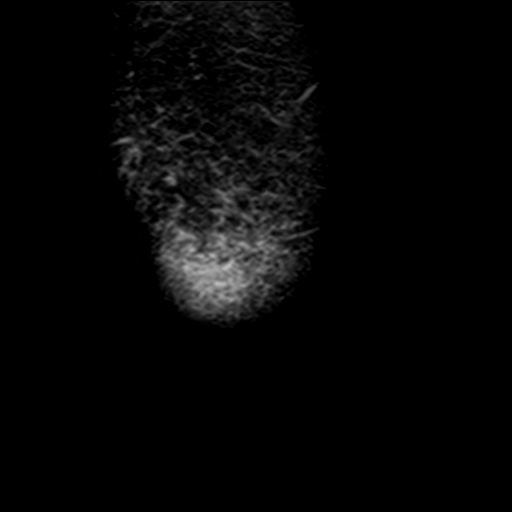
[im 13/26]
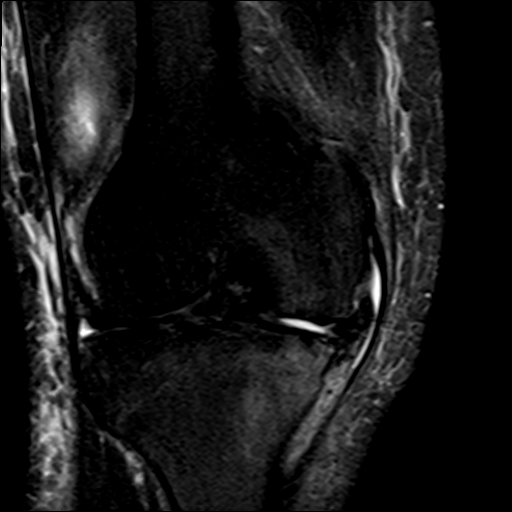
[im 26/26]
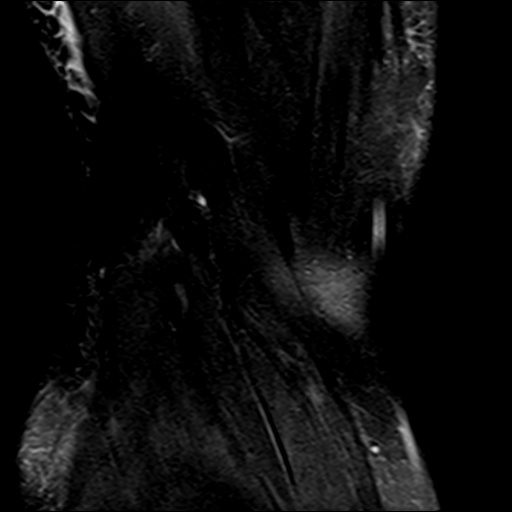

[Series 6: PD fat-sat · coronal · 4.0mm · 0.29mm/px · 6 of 29 slices shown (1 of 2)]
[im 1/29]
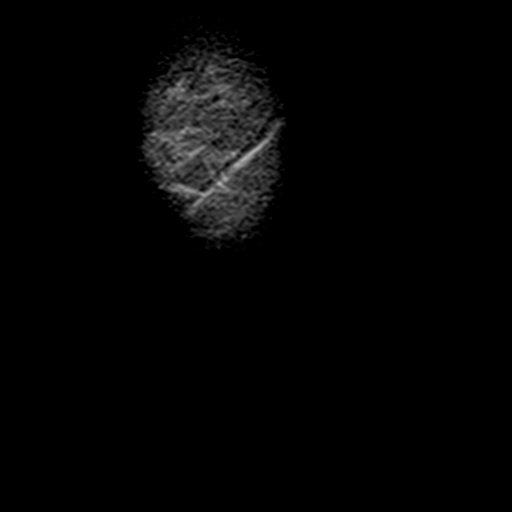
[im 6/29]
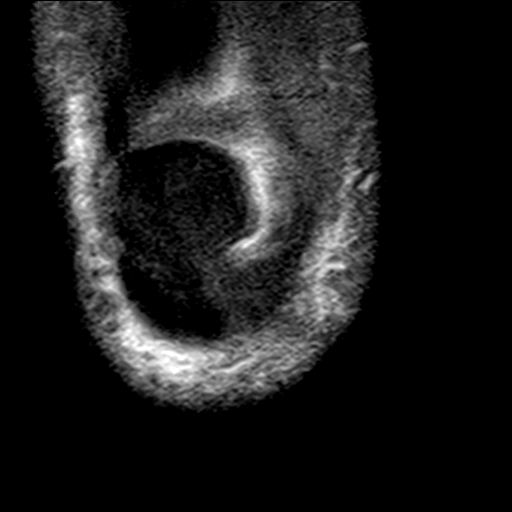
[im 12/29]
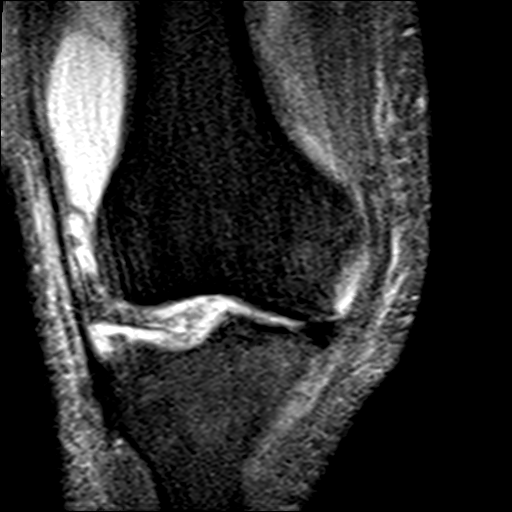
[im 17/29]
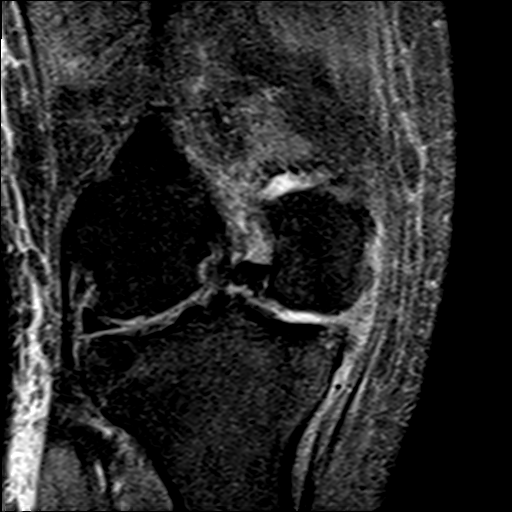
[im 23/29]
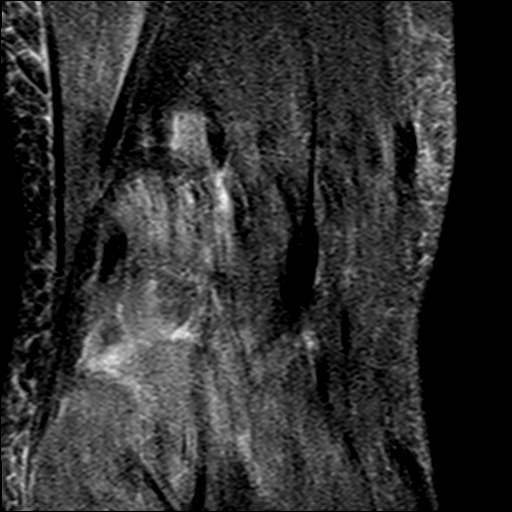
[im 29/29]
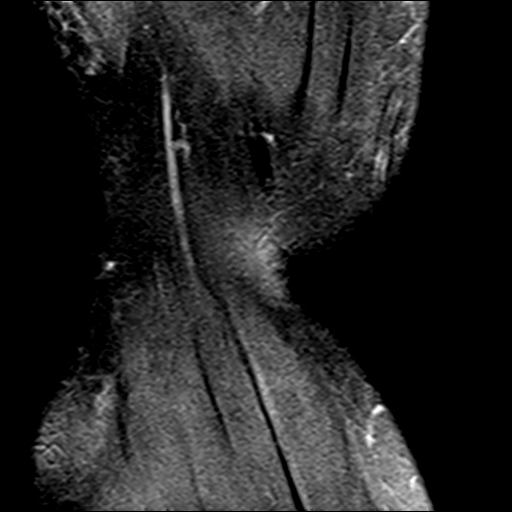

[Series 7: PD fat-sat · sagittal · 3.0mm · 0.29mm/px · 6 of 30 slices shown (2 of 2)]
[im 1/30]
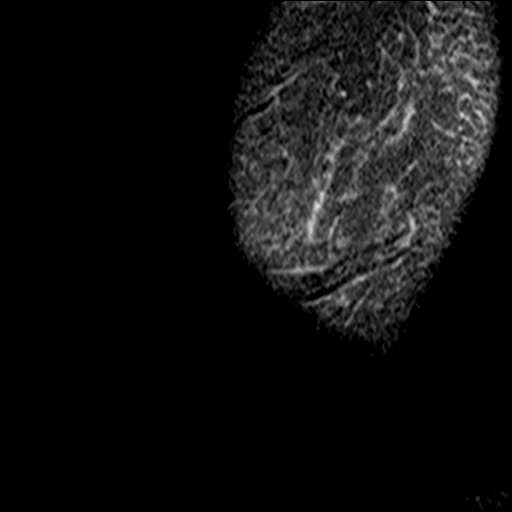
[im 6/30]
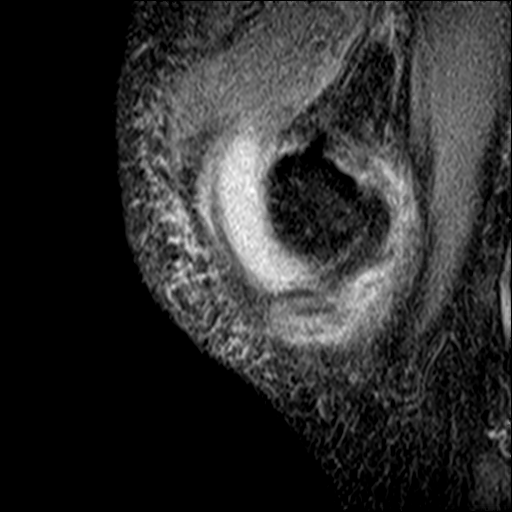
[im 12/30]
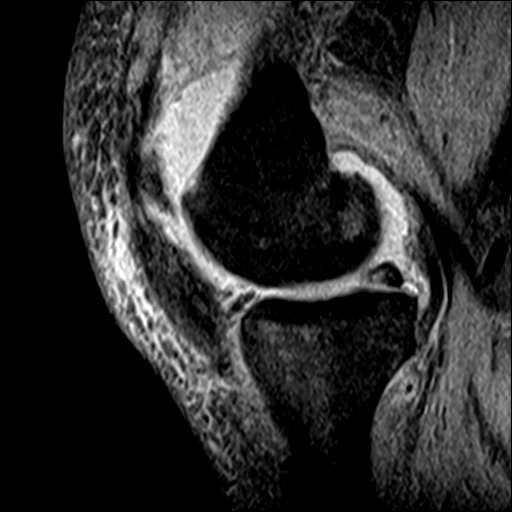
[im 18/30]
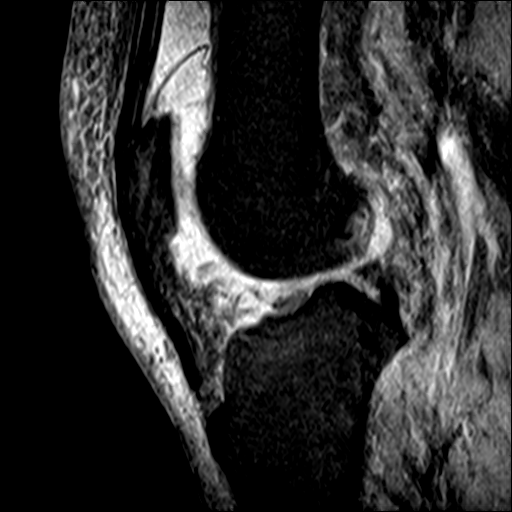
[im 24/30]
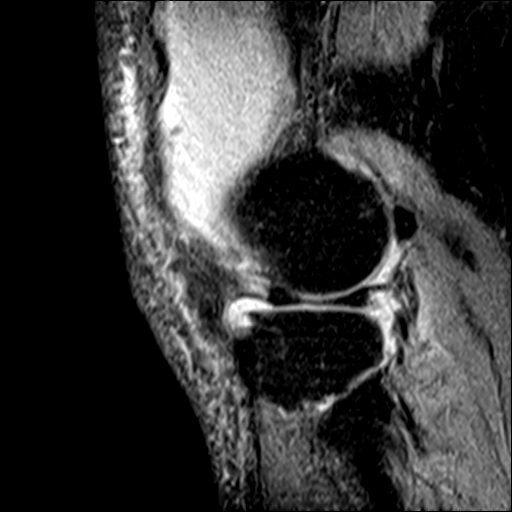
[im 30/30]
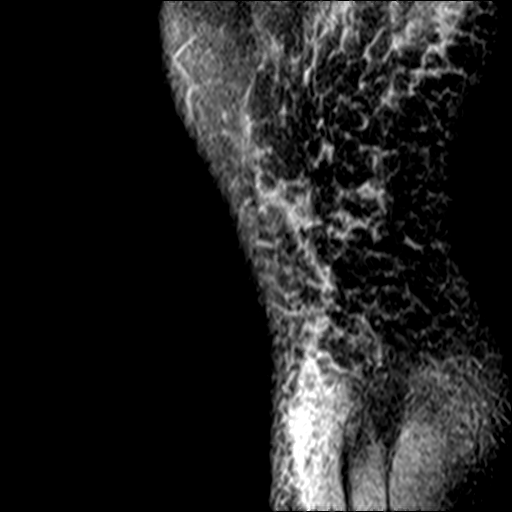

[18 of 40 positions shown; findings below may reference images not displayed]

FINDINGS: Patient motion degrades image quality limiting evaluation.

MENISCI

Medial: Complex tear of the posterior horn of the medial meniscus
with peripheral meniscal extrusion. Maceration of the body of the
medial meniscus.

Lateral: Degeneration of the root of the posterior horn of the
lateral meniscus without a discrete tear, but evaluation is limited
secondary to patient motion. Irregularity along the superior surface
of the body of the lateral meniscus. Next.

LIGAMENTS

Cruciates: ACL and PCL are intact.

Collaterals: Medial collateral ligament is intact. Lateral
collateral ligament complex is intact.

CARTILAGE

Patellofemoral: Mild partial-thickness cartilage loss of the
patellofemoral compartment.

Medial: Extensive full-thickness cartilage loss of the medial
femorotibial compartment with subchondral reactive marrow edema.

Lateral: Partial thickness cartilage loss of the lateral
femorotibial compartment with small areas of high-grade
partial-thickness cartilage loss of the lateral femoral condyle and
lateral tibial plateau.

JOINT: Large joint effusion. Normal Roberto Tiger. No plical
thickening.

POPLITEAL FOSSA: Popliteus tendon is intact. No Baker's cyst.

EXTENSOR MECHANISM: Intact quadriceps tendon. Intact patellar
tendon. Intact lateral patellar retinaculum. Intact medial patellar
retinaculum. Intact MPFL.

BONES: No aggressive osseous lesion. No fracture or dislocation.

Other: No fluid collection or hematoma. Muscles are normal.
IMPRESSION: 1. Complex tear of the posterior horn of the medial meniscus with
peripheral meniscal extrusion. Maceration of the body of the medial
meniscus.
2. Degeneration of the root of the posterior horn of the lateral
meniscus without a discrete tear, but evaluation is limited
secondary to patient motion. Irregularity along the superior surface
of the body of the lateral meniscus.
3. Tricompartmental cartilage abnormalities as described above most
severe in the medial femorotibial compartment.
4. Large joint effusion.

## 2022-05-04 ENCOUNTER — Other Ambulatory Visit: Payer: Self-pay | Admitting: Physician Assistant

## 2022-05-25 ENCOUNTER — Ambulatory Visit (INDEPENDENT_AMBULATORY_CARE_PROVIDER_SITE_OTHER): Payer: PPO | Admitting: Physician Assistant

## 2022-05-25 ENCOUNTER — Encounter: Payer: Self-pay | Admitting: Physician Assistant

## 2022-05-25 DIAGNOSIS — M1711 Unilateral primary osteoarthritis, right knee: Secondary | ICD-10-CM

## 2022-05-25 MED ORDER — MELOXICAM 7.5 MG PO TABS: 7.5000 mg | ORAL_TABLET | Freq: Every day | ORAL | 0 refills | Status: DC

## 2022-05-25 MED ORDER — LIDOCAINE HCL 1 % IJ SOLN
2.0000 mL | INTRAMUSCULAR | Status: AC | PRN
  Administered 2022-05-25: 2 mL

## 2022-05-25 MED ORDER — BUPIVACAINE HCL 0.25 % IJ SOLN
2.0000 mL | INTRAMUSCULAR | Status: AC | PRN
  Administered 2022-05-25: 2 mL via INTRA_ARTICULAR

## 2022-05-25 MED ORDER — METHYLPREDNISOLONE ACETATE 40 MG/ML IJ SUSP
60.0000 mg | INTRAMUSCULAR | Status: AC | PRN
  Administered 2022-05-25: 60 mg via INTRA_ARTICULAR
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
## 2022-07-16 ENCOUNTER — Other Ambulatory Visit: Payer: Self-pay | Admitting: Physician Assistant

## 2022-07-16 DIAGNOSIS — I10 Essential (primary) hypertension: Secondary | ICD-10-CM

## 2022-08-03 ENCOUNTER — Other Ambulatory Visit: Payer: Self-pay | Admitting: Physician Assistant

## 2022-08-23 ENCOUNTER — Other Ambulatory Visit: Payer: Self-pay | Admitting: Physician Assistant

## 2022-08-31 ENCOUNTER — Ambulatory Visit (INDEPENDENT_AMBULATORY_CARE_PROVIDER_SITE_OTHER): Payer: PPO | Admitting: Physician Assistant

## 2022-08-31 ENCOUNTER — Encounter: Payer: Self-pay | Admitting: Physician Assistant

## 2022-08-31 VITALS — BP 136/82 | HR 76 | Temp 97.3°F | Ht 68.5 in | Wt 224.8 lb

## 2022-08-31 DIAGNOSIS — I10 Essential (primary) hypertension: Secondary | ICD-10-CM

## 2022-08-31 DIAGNOSIS — E785 Hyperlipidemia, unspecified: Secondary | ICD-10-CM | POA: Diagnosis not present

## 2022-08-31 DIAGNOSIS — E1169 Type 2 diabetes mellitus with other specified complication: Secondary | ICD-10-CM | POA: Diagnosis not present

## 2022-08-31 DIAGNOSIS — Z7984 Long term (current) use of oral hypoglycemic drugs: Secondary | ICD-10-CM

## 2022-08-31 DIAGNOSIS — Z125 Encounter for screening for malignant neoplasm of prostate: Secondary | ICD-10-CM

## 2022-08-31 DIAGNOSIS — E782 Mixed hyperlipidemia: Secondary | ICD-10-CM | POA: Diagnosis not present

## 2022-08-31 DIAGNOSIS — F418 Other specified anxiety disorders: Secondary | ICD-10-CM | POA: Insufficient documentation

## 2022-08-31 MED ORDER — FLUOXETINE HCL 10 MG PO CAPS
10.0000 mg | ORAL_CAPSULE | Freq: Every day | ORAL | 3 refills | Status: DC
Start: 2022-08-31 — End: 2022-09-02

## 2022-08-31 MED ORDER — FLUOXETINE HCL 10 MG PO TABS: 10.0000 mg | ORAL_TABLET | Freq: Every day | ORAL | 3 refills | Status: DC

## 2022-08-31 NOTE — Progress Notes (Signed)
Subjective:  Patient ID: Maxwell Williams, male    DOB: 09/16/1955  Age: 67 y.o. MRN: [id number]  Chief Complaint  Patient presents with   Diabetes      Pt presents for follow up of hypertension.  The patient is tolerating the medication well without side effects. Compliance with treatment has been good; including taking medication as directed , maintains a healthy diet and regular exercise regimen , and following up as directed. Currently taking zestril 40mg  qd --   Mixed hyperlipidemia  Pt presents with hyperlipidemia.  Compliance with treatment has been good The patient is compliant with medications, maintains a low cholesterol diet , follows up as directed , and maintains an exercise regimen . The patient denies experiencing any hypercholesterolemia related symptoms. Currently on crestor 5mg  qd and fish oil  Pt with diabetes - currently on glucophage 500mg  bid - - tolerating medication well Does not check glucose at home - he would like to try ozempic - will get lab results since he is overdue and see where glucose and hgb A1c is and consider   Pt with anxiety and now having mild depressive symptoms - he had been on buspar but stopped that medication on his own citing it was not helpful.  He scored 0 on GAD and PHQ9 but states he feels lack of motivation and depressed mood most days   Current Outpatient Medications on File Prior to Visit  Medication Sig Dispense Refill   busPIRone (BUSPAR) 10 MG tablet TAKE 1 TABLET(10 MG) BY MOUTH TWICE DAILY 180 tablet 1   diclofenac Sodium (VOLTAREN) 1 % GEL Apply to affected areas bid as needed 100 g 2   hydrochlorothiazide (HYDRODIURIL) 25 MG tablet TAKE 1 TABLET(25 MG) BY MOUTH DAILY 90 tablet 0   lisinopril (ZESTRIL) 40 MG tablet TAKE 1 TABLET(40 MG) BY MOUTH DAILY 90 tablet 1   meloxicam (MOBIC) 7.5 MG tablet TAKE 1 TABLET(7.5 MG) BY MOUTH DAILY 90 tablet 0   metFORMIN (GLUCOPHAGE) 500 MG tablet TAKE 1 TABLET(500 MG) BY MOUTH TWICE DAILY WITH  A MEAL 180 tablet 0   Omega-3 Fatty Acids (FISH OIL) 1000 MG CAPS Take by mouth.     rosuvastatin (CRESTOR) 5 MG tablet TAKE 1 TABLET(5 MG) BY MOUTH DAILY 90 tablet 0   No current facility-administered medications on file prior to visit.   Past Medical History:  Diagnosis Date   Bilateral carpal tunnel syndrome 09/22/2015   Hypertension    Mixed hyperlipidemia 09/22/2015   Unilateral primary osteoarthritis, left knee 07/31/2019   Unilateral primary osteoarthritis, right knee 07/24/2020   Past Surgical History:  Procedure Laterality Date   KNEE SURGERY Left    WISDOM TOOTH EXTRACTION      Family History  Problem Relation Age of Onset   COPD Mother    Breast cancer Mother    Ovarian cancer Mother    Stroke Father    Prostate cancer Brother    Diabetes Brother    Social History   Socioeconomic History   Marital status: Single    Spouse name: Not on file   Number of children: 0   Years of education: Not on file   Highest education level: Bachelor's degree (e.g., BA, AB, BS)  Occupational History   Occupation: Retired  Tobacco Use   Smoking status: Former    Types: Cigarettes    Quit date: 1996    Years since quitting: 28.5   Smokeless tobacco: Never  Vaping Use   Vaping Use:  Never used  Substance and Sexual Activity   Alcohol use: Not Currently    Alcohol/week: 2.0 standard drinks of alcohol    Types: 2 Glasses of wine per week    Comment: Stopped drinking wine   Drug use: No   Sexual activity: Not Currently  Other Topics Concern   Not on file  Social History Narrative   Not on file   Social Determinants of Health   Financial Resource Strain: Low Risk  (08/30/2022)   Overall Financial Resource Strain (CARDIA)    Difficulty of Paying Living Expenses: Not very hard  Food Insecurity: No Food Insecurity (08/30/2022)   Hunger Vital Sign    Worried About Running Out of Food in the Last Year: Never true    Ran Out of Food in the Last Year: Never true  Transportation  Needs: No Transportation Needs (08/30/2022)   PRAPARE - Administrator, Civil Service (Medical): No    Lack of Transportation (Non-Medical): No  Physical Activity: Sufficiently Active (08/30/2022)   Exercise Vital Sign    Days of Exercise per Week: 3 days    Minutes of Exercise per Session: 90 min  Stress: Stress Concern Present (08/30/2022)   [employer] of Occupational Health - Occupational Stress Questionnaire    Feeling of Stress : To some extent  Social Connections: Socially Isolated (08/30/2022)   Social Connection and Isolation Panel [NHANES]    Frequency of Communication with Friends and Family: More than three times a week    Frequency of Social Gatherings with Friends and Family: Once a week    Attends Religious Services: Never    Database administrator or Organizations: No    Attends Engineer, structural: Never    Marital Status: Never married   CONSTITUTIONAL: Negative for chills, fatigue, fever, unintentional weight gain and unintentional weight loss.  E/N/T: Negative for ear pain, nasal congestion and sore throat.  CARDIOVASCULAR: Negative for chest pain, dizziness, palpitations and pedal edema.  RESPIRATORY: Negative for recent cough and dyspnea.  GASTROINTESTINAL: Negative for abdominal pain, acid reflux symptoms, constipation, diarrhea, nausea and vomiting.  MSK: has chronic knee pain - takes meloxicam INTEGUMENTARY: Negative for rash.  NEUROLOGICAL: Negative for dizziness and headaches.  PSYCHIATRIC: see HPI  Objective:  PHYSICAL EXAM:   VS: BP 136/82 (BP Location: Left Arm, Patient Position: Sitting, Cuff Size: Large)   Pulse 76   Temp (!) 97.3 F (36.3 C) (Temporal)   Ht 5' 8.5" (1.74 m)   Wt 224 lb 12.8 oz (102 kg)   SpO2 99%   BMI 33.68 kg/m   GEN: Well nourished, well developed, in no acute distress  Cardiac: RRR; no murmurs, rubs, or gallops,no edema -  Respiratory:  normal respiratory rate and pattern with no distress -  normal breath sounds with no rales, rhonchi, wheezes or rubs MS: no deformity or atrophy  Skin: warm and dry, no rash  Psych: euthymic mood, appropriate affect and demeanor   Lab Results  Component Value Date   WBC 6.8 01/20/2022   HGB 14.5 01/20/2022   HCT 42.8 01/20/2022   PLT 224 01/20/2022   GLUCOSE 146 (H) 01/20/2022   CHOL 178 01/20/2022   TRIG 244 (H) 01/20/2022   HDL 51 01/20/2022   LDLCALC 87 01/20/2022   ALT 17 01/20/2022   AST 16 01/20/2022   NA 142 01/20/2022   K 4.2 01/20/2022   CL 100 01/20/2022   CREATININE 0.87 01/20/2022   BUN 15 01/20/2022  CO2 23 01/20/2022   TSH 0.946 07/16/2021   HGBA1C 6.3 (H) 01/20/2022      Assessment & Plan:   Problem List Items Addressed This Visit       Cardiovascular and Mediastinum   Essential hypertension - Primary   Relevant Medications   hydrochlorothiazide (HYDRODIURIL) 25 MG tablet Continue lisinopril 40mg  qd    Other Relevant Orders   CBC with Differential/Platelet   Comprehensive metabolic panel     Musculoskeletal and Integument   Generalized OA Use voltaren gel as directed      Other   Hyperlipidemia associated with type 2 diabetes mellitus (HCC)   Relevant Medications   hydrochlorothiazide (HYDRODIURIL) 25 MG tablet   Other Relevant Orders   Lipid panel Continue crestor and fish oil   Other Visit Diagnoses      Depression with anxiety Rx for prozac 10mg  qd   Non-insulin dependent type 2 diabetes mellitus (HCC)       Relevant Orders   Hemoglobin A1c Continue glucophage      .  Meds ordered this encounter  Medications                            FLUoxetine (PROZAC) 10 MG capsule    Sig: Take 1 capsule (10 mg total) by mouth daily.    Dispense:  30 capsule    Refill:  3    Order Specific Question:   Supervising Provider    AnswerBlane Ohara [phone]    Orders Placed This Encounter  Procedures   CBC with Differential/Platelet   Comprehensive metabolic panel   TSH    Lipid panel   Hemoglobin A1c   PSA     Follow-up: Return in about 6 weeks (around 10/12/2022) for follow up -=-= fasting labs this week.  An After Visit Summary was printed and given to the patient.  Jettie Pagan [facility] [phone]

## 2022-09-01 ENCOUNTER — Other Ambulatory Visit: Payer: Self-pay

## 2022-09-02 ENCOUNTER — Other Ambulatory Visit: Payer: Self-pay

## 2022-09-02 DIAGNOSIS — F418 Other specified anxiety disorders: Secondary | ICD-10-CM

## 2022-09-02 MED ORDER — FLUOXETINE HCL 10 MG PO CAPS
10.0000 mg | ORAL_CAPSULE | Freq: Every day | ORAL | 3 refills | Status: DC
Start: 2022-09-02 — End: 2023-02-14

## 2022-09-03 ENCOUNTER — Other Ambulatory Visit: Payer: PPO

## 2022-09-03 DIAGNOSIS — E1169 Type 2 diabetes mellitus with other specified complication: Secondary | ICD-10-CM

## 2022-09-03 DIAGNOSIS — E782 Mixed hyperlipidemia: Secondary | ICD-10-CM

## 2022-09-03 DIAGNOSIS — E785 Hyperlipidemia, unspecified: Secondary | ICD-10-CM | POA: Diagnosis not present

## 2022-09-03 DIAGNOSIS — I10 Essential (primary) hypertension: Secondary | ICD-10-CM | POA: Diagnosis not present

## 2022-09-03 DIAGNOSIS — Z125 Encounter for screening for malignant neoplasm of prostate: Secondary | ICD-10-CM | POA: Diagnosis not present

## 2022-09-04 LAB — CBC WITH DIFFERENTIAL/PLATELET
Basophils Absolute: 0.1 10*3/uL (ref 0.0–0.2)
Basos: 1 %
EOS (ABSOLUTE): 0.5 10*3/uL — ABNORMAL HIGH (ref 0.0–0.4)
Eos: 8 %
Hematocrit: 42.5 % (ref 37.5–51.0)
Hemoglobin: 14 g/dL (ref 13.0–17.7)
Immature Grans (Abs): 0 10*3/uL (ref 0.0–0.1)
Immature Granulocytes: 0 %
Lymphocytes Absolute: 1.6 10*3/uL (ref 0.7–3.1)
Lymphs: 23 %
MCH: 30 pg (ref 26.6–33.0)
MCHC: 32.9 g/dL (ref 31.5–35.7)
MCV: 91 fL (ref 79–97)
Monocytes Absolute: 0.5 10*3/uL (ref 0.1–0.9)
Monocytes: 7 %
Neutrophils Absolute: 4.2 10*3/uL (ref 1.4–7.0)
Neutrophils: 61 %
Platelets: 220 10*3/uL (ref 150–450)
RBC: 4.66 x10E6/uL (ref 4.14–5.80)
RDW: 12.4 % (ref 11.6–15.4)
WBC: 6.8 10*3/uL (ref 3.4–10.8)

## 2022-09-04 LAB — LIPID PANEL
Chol/HDL Ratio: 3.6 ratio (ref 0.0–5.0)
Cholesterol, Total: 157 mg/dL (ref 100–199)
HDL: 44 mg/dL (ref >39)
LDL Chol Calc (NIH): 78 mg/dL (ref 0–99)
Triglycerides: 209 mg/dL — ABNORMAL HIGH (ref 0–149)
VLDL Cholesterol Cal: 35 mg/dL (ref 5–40)

## 2022-09-04 LAB — HEMOGLOBIN A1C
Est. average glucose Bld gHb Est-mCnc: 148 mg/dL
Hgb A1c MFr Bld: 6.8 % — ABNORMAL HIGH (ref 4.8–5.6)

## 2022-09-04 LAB — COMPREHENSIVE METABOLIC PANEL
ALT: 19 IU/L (ref 0–44)
AST: 17 IU/L (ref 0–40)
Albumin: 4.6 g/dL (ref 3.9–4.9)
Alkaline Phosphatase: 52 IU/L (ref 44–121)
BUN/Creatinine Ratio: 19 (ref 10–24)
BUN: 16 mg/dL (ref 8–27)
Bilirubin Total: 0.6 mg/dL (ref 0.0–1.2)
CO2: 23 mmol/L (ref 20–29)
Calcium: 9.5 mg/dL (ref 8.6–10.2)
Chloride: 100 mmol/L (ref 96–106)
Creatinine, Ser: 0.83 mg/dL (ref 0.76–1.27)
Globulin, Total: 2 g/dL (ref 1.5–4.5)
Glucose: 144 mg/dL — ABNORMAL HIGH (ref 70–99)
Potassium: 4 mmol/L (ref 3.5–5.2)
Sodium: 139 mmol/L (ref 134–144)
Total Protein: 6.6 g/dL (ref 6.0–8.5)
eGFR: 96 mL/min/{1.73_m2} (ref >59)

## 2022-09-04 LAB — PSA: Prostate Specific Ag, Serum: 3.9 ng/mL (ref 0.0–4.0)

## 2022-09-04 LAB — TSH: TSH: 1.19 u[IU]/mL (ref 0.450–4.500)

## 2022-09-06 ENCOUNTER — Other Ambulatory Visit: Payer: Self-pay | Admitting: Physician Assistant

## 2022-09-06 ENCOUNTER — Other Ambulatory Visit: Payer: Self-pay

## 2022-09-06 DIAGNOSIS — E119 Type 2 diabetes mellitus without complications: Secondary | ICD-10-CM

## 2022-09-06 MED ORDER — OZEMPIC (0.25 OR 0.5 MG/DOSE) 2 MG/3ML ~~LOC~~ SOPN
0.2500 mg | PEN_INJECTOR | SUBCUTANEOUS | 0 refills | Status: DC
Start: 2022-09-06 — End: 2022-10-11

## 2022-09-23 ENCOUNTER — Other Ambulatory Visit: Payer: Self-pay | Admitting: Physician Assistant

## 2022-09-23 DIAGNOSIS — E782 Mixed hyperlipidemia: Secondary | ICD-10-CM

## 2022-09-23 MED ORDER — ROSUVASTATIN CALCIUM 10 MG PO TABS: 10.0000 mg | ORAL_TABLET | Freq: Every day | ORAL | 1 refills | Status: DC

## 2022-10-11 ENCOUNTER — Telehealth: Payer: Self-pay

## 2022-10-11 ENCOUNTER — Other Ambulatory Visit: Payer: Self-pay | Admitting: Physician Assistant

## 2022-10-11 DIAGNOSIS — E119 Type 2 diabetes mellitus without complications: Secondary | ICD-10-CM

## 2022-10-11 MED ORDER — OZEMPIC (0.25 OR 0.5 MG/DOSE) 2 MG/3ML ~~LOC~~ SOPN
0.5000 mg | PEN_INJECTOR | SUBCUTANEOUS | 0 refills | Status: DC
Start: 2022-10-11 — End: 2022-10-18

## 2022-10-11 NOTE — Telephone Encounter (Signed)
Sent!

## 2022-10-11 NOTE — Telephone Encounter (Signed)
Patient called and stated he was tolerating the Ozempic 0.25 mg, and will be needed to move up to next dose. Please send refill to [facility] on [address]

## 2022-10-17 ENCOUNTER — Other Ambulatory Visit: Payer: Self-pay | Admitting: Physician Assistant

## 2022-10-17 DIAGNOSIS — I10 Essential (primary) hypertension: Secondary | ICD-10-CM

## 2022-10-17 DIAGNOSIS — E119 Type 2 diabetes mellitus without complications: Secondary | ICD-10-CM

## 2022-11-22 ENCOUNTER — Other Ambulatory Visit: Payer: Self-pay | Admitting: Physician Assistant

## 2022-11-23 ENCOUNTER — Other Ambulatory Visit: Payer: Self-pay | Admitting: Physician Assistant

## 2022-12-14 ENCOUNTER — Other Ambulatory Visit: Payer: Self-pay | Admitting: Family Medicine

## 2022-12-14 ENCOUNTER — Other Ambulatory Visit: Payer: Self-pay | Admitting: Physician Assistant

## 2022-12-14 DIAGNOSIS — E119 Type 2 diabetes mellitus without complications: Secondary | ICD-10-CM

## 2022-12-14 MED ORDER — OZEMPIC (0.25 OR 0.5 MG/DOSE) 2 MG/3ML ~~LOC~~ SOPN: 0.5000 mg | PEN_INJECTOR | SUBCUTANEOUS | 0 refills | Status: DC

## 2022-12-23 ENCOUNTER — Other Ambulatory Visit: Payer: Self-pay | Admitting: Physician Assistant

## 2022-12-23 DIAGNOSIS — E782 Mixed hyperlipidemia: Secondary | ICD-10-CM

## 2023-01-12 ENCOUNTER — Other Ambulatory Visit: Payer: Self-pay | Admitting: Family Medicine

## 2023-01-12 DIAGNOSIS — I10 Essential (primary) hypertension: Secondary | ICD-10-CM

## 2023-01-13 ENCOUNTER — Ambulatory Visit (INDEPENDENT_AMBULATORY_CARE_PROVIDER_SITE_OTHER): Payer: PPO | Admitting: Physician Assistant

## 2023-01-13 ENCOUNTER — Encounter: Payer: Self-pay | Admitting: Physician Assistant

## 2023-01-13 VITALS — BP 118/78 | HR 79 | Temp 97.9°F | Ht 68.5 in | Wt 214.0 lb

## 2023-01-13 DIAGNOSIS — E1159 Type 2 diabetes mellitus with other circulatory complications: Secondary | ICD-10-CM

## 2023-01-13 DIAGNOSIS — I152 Hypertension secondary to endocrine disorders: Secondary | ICD-10-CM

## 2023-01-13 DIAGNOSIS — E785 Hyperlipidemia, unspecified: Secondary | ICD-10-CM | POA: Diagnosis not present

## 2023-01-13 DIAGNOSIS — F418 Other specified anxiety disorders: Secondary | ICD-10-CM

## 2023-01-13 DIAGNOSIS — E119 Type 2 diabetes mellitus without complications: Secondary | ICD-10-CM | POA: Diagnosis not present

## 2023-01-13 DIAGNOSIS — E1169 Type 2 diabetes mellitus with other specified complication: Secondary | ICD-10-CM | POA: Diagnosis not present

## 2023-01-13 MED ORDER — SEMAGLUTIDE (1 MG/DOSE) 4 MG/3ML ~~LOC~~ SOPN: 1.0000 mg | PEN_INJECTOR | SUBCUTANEOUS | 3 refills | Status: DC

## 2023-01-13 NOTE — Progress Notes (Signed)
Subjective:  Patient ID: Maxwell Williams, male    DOB: 1955/04/11  Age: 67 y.o. MRN: [id number]  Chief Complaint  Patient presents with   Medical Management of Chronic Issues    HPI Pt presents for follow up of hypertension.  The patient is tolerating the medication well without side effects. Compliance with treatment has been good; including taking medication as directed , maintains a healthy diet and regular exercise regimen , and following up as directed. Taking zestril 40mg  qd  Mixed hyperlipidemia  Pt presents with hyperlipidemia. Compliance with treatment has been good -The patient is compliant with medications, maintains a low cholesterol diet , follows up as directed , and maintains an exercise regimen . The patient denies experiencing any hypercholesterolemia related symptoms. Taking crestor 10mg  qd and fish oil  Pt with NIDDM - currently taking ozempic 0.5mg  weekly - states is tolerating medication well and is ready to increase his dose.  It has helped with him not overeating and is losing some weight.  He does not check his glucose regularly.  He denies any GI issues with ozempic  Pt with depression with anxiety - symptoms are stable on buspar 10mg  qd and prozac 10mg  qd Voices no concerns  Pt with chronic knee pain from osteoarthritis - uses mobic 7.5mg  daily and alternates with voltaren gel       01/13/2023    8:55 AM 08/31/2022   10:34 AM 02/03/2022   10:37 AM 01/20/2022    8:18 AM 08/14/2020    7:24 PM  Depression screen PHQ 2/9  Decreased Interest 0 0 0 0 0  Down, Depressed, Hopeless 0 0 1 1 0  PHQ - 2 Score 0 0 1 1 0  Altered sleeping 0  0 0   Tired, decreased energy 0  0 0   Change in appetite 0  0 0   Feeling bad or failure about yourself  0  0 0   Trouble concentrating 0  0 0   Moving slowly or fidgety/restless 0  0 0   Suicidal thoughts 0  0 0   PHQ-9 Score 0  1 1   Difficult doing work/chores Not difficult at all  Not difficult at all Not difficult at all          12/14/2021   10:53 AM 01/20/2022    8:18 AM 02/02/2022   10:57 AM 08/31/2022   10:34 AM 01/13/2023    8:55 AM  Fall Risk  Falls in the past year? 0 0 0 0 0  Was there an injury with Fall? 0 0 0 0 0  Fall Risk Category Calculator 0 0 0 0 0  Fall Risk Category (Retired) Low Low Low    (RETIRED) Patient Fall Risk Level  Low fall risk Low fall risk    Patient at Risk for Falls Due to No Fall Risks No Fall Risks No Fall Risks No Fall Risks No Fall Risks  Fall risk Follow up Falls evaluation completed;Falls prevention discussed Falls evaluation completed Falls evaluation completed;Education provided Falls evaluation completed Falls evaluation completed     ROS CONSTITUTIONAL: Negative for chills, fatigue, fever, unintentional weight gain and unintentional weight loss.  E/N/T: Negative for ear pain, nasal congestion and sore throat.  CARDIOVASCULAR: Negative for chest pain, dizziness, palpitations and pedal edema.  RESPIRATORY: Negative for recent cough and dyspnea.  GASTROINTESTINAL: Negative for abdominal pain, acid reflux symptoms, constipation, diarrhea, nausea and vomiting.  MSK: Negative for arthralgias and myalgias.  INTEGUMENTARY: Negative  for rash.  NEUROLOGICAL: Negative for dizziness and headaches.  PSYCHIATRIC: Negative for sleep disturbance and to question depression screen.  Negative for depression, negative for anhedonia.    Current Outpatient Medications:    busPIRone (BUSPAR) 10 MG tablet, TAKE 1 TABLET(10 MG) BY MOUTH TWICE DAILY, Disp: 180 tablet, Rfl: 1   diclofenac Sodium (VOLTAREN) 1 % GEL, Apply to affected areas bid as needed, Disp: 100 g, Rfl: 2   FLUoxetine (PROZAC) 10 MG capsule, Take 1 capsule (10 mg total) by mouth daily., Disp: 30 capsule, Rfl: 3   hydrochlorothiazide (HYDRODIURIL) 25 MG tablet, TAKE 1 TABLET(25 MG) BY MOUTH DAILY, Disp: 90 tablet, Rfl: 0   lisinopril (ZESTRIL) 40 MG tablet, TAKE 1 TABLET(40 MG) BY MOUTH DAILY, Disp: 90 tablet, Rfl: 1    meloxicam (MOBIC) 7.5 MG tablet, TAKE 1 TABLET(7.5 MG) BY MOUTH DAILY, Disp: 90 tablet, Rfl: 0   metFORMIN (GLUCOPHAGE) 500 MG tablet, TAKE 1 TABLET(500 MG) BY MOUTH TWICE DAILY WITH A MEAL, Disp: 180 tablet, Rfl: 1   Omega-3 Fatty Acids (FISH OIL) 1000 MG CAPS, Take by mouth., Disp: , Rfl:    rosuvastatin (CRESTOR) 10 MG tablet, TAKE 1 TABLET(10 MG) BY MOUTH DAILY, Disp: 90 tablet, Rfl: 1   Semaglutide, 1 MG/DOSE, 4 MG/3ML SOPN, Inject 1 mg as directed once a week., Disp: 3 mL, Rfl: 3  Past Medical History:  Diagnosis Date   Bilateral carpal tunnel syndrome 09/22/2015   Hypertension    Mixed hyperlipidemia 09/22/2015   Unilateral primary osteoarthritis, left knee 07/31/2019   Unilateral primary osteoarthritis, right knee 07/24/2020   Objective:  PHYSICAL EXAM:   BP 118/78 (BP Location: Left Arm, Patient Position: Sitting)   Pulse 79   Temp 97.9 F (36.6 C) (Temporal)   Ht 5' 8.5" (1.74 m)   Wt 214 lb (97.1 kg)   SpO2 99%   BMI 32.07 kg/m    GEN: Well nourished, well developed, in no acute distress  Cardiac: RRR; no murmurs, rubs, or gallops,no edema -  Respiratory:  normal respiratory rate and pattern with no distress - normal breath sounds with no rales, rhonchi, wheezes or rubs  MS: no deformity or atrophy  Skin: warm and dry, no rash  Neuro:  Alert and Oriented x 3,  - CN II-Xii grossly intact Psych: euthymic mood, appropriate affect and demeanor  Assessment & Plan:    Non-insulin dependent type 2 diabetes mellitus (HCC) -     CBC with Differential/Platelet -     Comprehensive metabolic panel -     Hemoglobin A1c -     Lipid panel -     Semaglutide (1 MG/DOSE); Inject 1 mg as directed once a week.  Dispense: 3 mL; Refill: 3  Depression with anxiety Continue meds Hypertension associated with diabetes (HCC) Continue med Hyperlipidemia associated with type 2 diabetes mellitus (HCC) Continue meds Watch diet    Follow-up: Return in about 4 months (around 05/13/2023)  for chronic fasting follow-up.  An After Visit Summary was printed and given to the patient.  Jettie Pagan [facility] [phone]

## 2023-01-14 LAB — COMPREHENSIVE METABOLIC PANEL
ALT: 20 [IU]/L (ref 0–44)
AST: 14 [IU]/L (ref 0–40)
Albumin: 4.6 g/dL (ref 3.9–4.9)
Alkaline Phosphatase: 52 [IU]/L (ref 44–121)
BUN/Creatinine Ratio: 18 (ref 10–24)
BUN: 14 mg/dL (ref 8–27)
Bilirubin Total: 0.3 mg/dL (ref 0.0–1.2)
CO2: 24 mmol/L (ref 20–29)
Calcium: 9.3 mg/dL (ref 8.6–10.2)
Chloride: 103 mmol/L (ref 96–106)
Creatinine, Ser: 0.79 mg/dL (ref 0.76–1.27)
Globulin, Total: 2 g/dL (ref 1.5–4.5)
Glucose: 119 mg/dL — ABNORMAL HIGH (ref 70–99)
Potassium: 4 mmol/L (ref 3.5–5.2)
Sodium: 141 mmol/L (ref 134–144)
Total Protein: 6.6 g/dL (ref 6.0–8.5)
eGFR: 97 mL/min/{1.73_m2} (ref >59)

## 2023-01-14 LAB — HEMOGLOBIN A1C
Est. average glucose Bld gHb Est-mCnc: 120 mg/dL
Hgb A1c MFr Bld: 5.8 % — ABNORMAL HIGH (ref 4.8–5.6)

## 2023-01-14 LAB — CBC WITH DIFFERENTIAL/PLATELET
Basophils Absolute: 0.1 10*3/uL (ref 0.0–0.2)
Basos: 1 %
EOS (ABSOLUTE): 0.5 10*3/uL — ABNORMAL HIGH (ref 0.0–0.4)
Eos: 7 %
Hematocrit: 41.2 % (ref 37.5–51.0)
Hemoglobin: 13.9 g/dL (ref 13.0–17.7)
Immature Grans (Abs): 0 10*3/uL (ref 0.0–0.1)
Immature Granulocytes: 0 %
Lymphocytes Absolute: 1.4 10*3/uL (ref 0.7–3.1)
Lymphs: 20 %
MCH: 30.8 pg (ref 26.6–33.0)
MCHC: 33.7 g/dL (ref 31.5–35.7)
MCV: 91 fL (ref 79–97)
Monocytes Absolute: 0.4 10*3/uL (ref 0.1–0.9)
Monocytes: 6 %
Neutrophils Absolute: 4.7 10*3/uL (ref 1.4–7.0)
Neutrophils: 66 %
Platelets: 230 10*3/uL (ref 150–450)
RBC: 4.51 x10E6/uL (ref 4.14–5.80)
RDW: 12.4 % (ref 11.6–15.4)
WBC: 7.1 10*3/uL (ref 3.4–10.8)

## 2023-01-14 LAB — LIPID PANEL
Chol/HDL Ratio: 2.8 ratio (ref 0.0–5.0)
Cholesterol, Total: 115 mg/dL (ref 100–199)
HDL: 41 mg/dL (ref >39)
LDL Chol Calc (NIH): 48 mg/dL (ref 0–99)
Triglycerides: 151 mg/dL — ABNORMAL HIGH (ref 0–149)
VLDL Cholesterol Cal: 26 mg/dL (ref 5–40)

## 2023-01-20 ENCOUNTER — Ambulatory Visit: Payer: PPO

## 2023-01-20 DIAGNOSIS — Z0000 Encounter for general adult medical examination without abnormal findings: Secondary | ICD-10-CM

## 2023-01-20 NOTE — Progress Notes (Signed)
Subjective:   Maxwell Williams is a 67 y.o. male who presents for Medicare Annual/Subsequent preventive examination.  Visit Complete: Virtual I connected with  Maxwell Williams on 01/20/23 by a audio enabled telemedicine application and verified that I am speaking with the correct person using two identifiers.  Patient Location: Home  Provider Location: Home Office  I discussed the limitations of evaluation and management by telemedicine. The patient expressed understanding and agreed to proceed.  Vital Signs: Because this visit was a virtual/telehealth visit, some criteria may be missing or patient reported. Any vitals not documented were not able to be obtained and vitals that have been documented are patient reported.  Patient Medicare AWV questionnaire was completed by the patient on 01-13-2023; I have confirmed that all information answered by patient is correct and no changes since this date.  Cardiac Risk Factors include: advanced age (>2men, >74 women);obesity (BMI >30kg/m2);male gender;diabetes mellitus;hypertension     Objective:    There were no vitals filed for this visit. There is no height or weight on file to calculate BMI.     01/20/2023    8:06 AM 02/03/2022   10:39 AM 08/14/2020    7:24 PM  Advanced Directives  Does Patient Have a Medical Advance Directive? No No No  Would patient like information on creating a medical advance directive? No - Patient declined Yes (MAU/Ambulatory/Procedural Areas - Information given) Yes (MAU/Ambulatory/Procedural Areas - Information given)    Current Medications (verified) Outpatient Encounter Medications as of 01/20/2023  Medication Sig   busPIRone (BUSPAR) 10 MG tablet TAKE 1 TABLET(10 MG) BY MOUTH TWICE DAILY   diclofenac Sodium (VOLTAREN) 1 % GEL Apply to affected areas bid as needed   FLUoxetine (PROZAC) 10 MG capsule Take 1 capsule (10 mg total) by mouth daily.   hydrochlorothiazide (HYDRODIURIL) 25 MG tablet TAKE 1  TABLET(25 MG) BY MOUTH DAILY   lisinopril (ZESTRIL) 40 MG tablet TAKE 1 TABLET(40 MG) BY MOUTH DAILY   meloxicam (MOBIC) 7.5 MG tablet TAKE 1 TABLET(7.5 MG) BY MOUTH DAILY   metFORMIN (GLUCOPHAGE) 500 MG tablet TAKE 1 TABLET(500 MG) BY MOUTH TWICE DAILY WITH A MEAL   Omega-3 Fatty Acids (FISH OIL) 1000 MG CAPS Take by mouth.   rosuvastatin (CRESTOR) 10 MG tablet TAKE 1 TABLET(10 MG) BY MOUTH DAILY   Semaglutide, 1 MG/DOSE, 4 MG/3ML SOPN Inject 1 mg as directed once a week.   No facility-administered encounter medications on file as of 01/20/2023.    Allergies (verified) Patient has no known allergies.   History: Past Medical History:  Diagnosis Date   Bilateral carpal tunnel syndrome 09/22/2015   Hypertension    Mixed hyperlipidemia 09/22/2015   Unilateral primary osteoarthritis, left knee 07/31/2019   Unilateral primary osteoarthritis, right knee 07/24/2020   Past Surgical History:  Procedure Laterality Date   KNEE SURGERY Left    WISDOM TOOTH EXTRACTION     Family History  Problem Relation Age of Onset   COPD Mother    Breast cancer Mother    Ovarian cancer Mother    Stroke Father    Prostate cancer Brother    Diabetes Brother    Social History   Socioeconomic History   Marital status: Single    Spouse name: Not on file   Number of children: 0   Years of education: Not on file   Highest education level: Bachelor's degree (e.g., BA, AB, BS)  Occupational History   Occupation: Retired  Tobacco Use   Smoking status:  Former    Current packs/day: 0.00    Types: Cigarettes    Quit date: 1996    Years since quitting: 28.8   Smokeless tobacco: Never  Vaping Use   Vaping status: Never Used  Substance and Sexual Activity   Alcohol use: Not Currently    Alcohol/week: 2.0 standard drinks of alcohol    Types: 2 Glasses of wine per week    Comment: Stopped drinking wine   Drug use: No   Sexual activity: Not Currently  Other Topics Concern   Not on file  Social History  Narrative   Not on file   Social Determinants of Health   Financial Resource Strain: Low Risk  (01/20/2023)   Overall Financial Resource Strain (CARDIA)    Difficulty of Paying Living Expenses: Not hard at all  Food Insecurity: No Food Insecurity (01/20/2023)   Hunger Vital Sign    Worried About Running Out of Food in the Last Year: Never true    Ran Out of Food in the Last Year: Never true  Transportation Needs: No Transportation Needs (01/20/2023)   PRAPARE - Administrator, Civil Service (Medical): No    Lack of Transportation (Non-Medical): No  Physical Activity: Inactive (01/20/2023)   Exercise Vital Sign    Days of Exercise per Week: 0 days    Minutes of Exercise per Session: 0 min  Stress: No Stress Concern Present (01/20/2023)   [employer] of Occupational Health - Occupational Stress Questionnaire    Feeling of Stress : Not at all  Social Connections: Socially Isolated (01/20/2023)   Social Connection and Isolation Panel [NHANES]    Frequency of Communication with Friends and Family: Three times a week    Frequency of Social Gatherings with Friends and Family: Once a week    Attends Religious Services: Never    Database administrator or Organizations: No    Attends Engineer, structural: Never    Marital Status: Never married    Tobacco Counseling Counseling given: Not Answered   Clinical Intake:  Pre-visit preparation completed: Yes  Pain : No/denies pain     Diabetes: Yes CBG done?: No Did pt. bring in CBG monitor from home?: No  How often do you need to have someone help you when you read instructions, pamphlets, or other written materials from your doctor or pharmacy?: 1 - Never  Interpreter Needed?: No  Information entered by :: Remi Haggard LPN   Activities of Daily Living    01/20/2023    8:07 AM 02/02/2022   10:57 AM  In your present state of health, do you have any difficulty performing the following activities:   Hearing?  0  Vision? 0 0  Difficulty concentrating or making decisions? 0 0  Walking or climbing stairs? 0 1  Dressing or bathing? 0 0  Doing errands, shopping? 0 0  Preparing Food and eating ? N N  Using the Toilet? N N  In the past six months, have you accidently leaked urine? N N  Do you have problems with loss of bowel control? N N  Managing your Medications? N N  Managing your Finances? N N  Housekeeping or managing your Housekeeping? N N    Patient Care Team: Marianne Sofia, Cordelia Poche as PCP - General (Physician Assistant)  Indicate any recent Medical Services you may have received from other than [facility] providers in the past year (date may be approximate).     Assessment:   This is  a routine wellness examination for [facility].  Hearing/Vision screen Hearing Screening - Comments:: No trouble hearing Vision Screening - Comments:: Not up to date Walkers       Goals Addressed             This Visit's Progress    Weight (lb) < 200 lb (90.7 kg)       Loose weight       Depression Screen    01/20/2023    8:12 AM 01/13/2023    8:55 AM 08/31/2022   10:34 AM 02/03/2022   10:37 AM 01/20/2022    8:18 AM 08/14/2020    7:24 PM 06/26/2020    3:50 PM  PHQ 2/9 Scores  PHQ - 2 Score 2 0 0 1 1 0 0  PHQ- 9 Score 2 0  1 1      Fall Risk    01/20/2023    8:04 AM 01/13/2023    8:55 AM 08/31/2022   10:34 AM 02/02/2022   10:57 AM 01/20/2022    8:18 AM  Fall Risk   Falls in the past year? 0 0 0 0 0  Number falls in past yr: 0 0 0 0 0  Injury with Fall? 0 0 0 0 0  Risk for fall due to :  No Fall Risks No Fall Risks No Fall Risks No Fall Risks  Follow up Falls evaluation completed;Education provided;Falls prevention discussed Falls evaluation completed Falls evaluation completed Falls evaluation completed;Education provided Falls evaluation completed    MEDICARE RISK AT HOME: Medicare Risk at Home Any stairs in or around the home?: Yes If so, are there any without handrails?:  No Home free of loose throw rugs in walkways, pet beds, electrical cords, etc?: Yes Adequate lighting in your home to reduce risk of falls?: Yes Life alert?: No Use of a cane, walker or w/c?: No Grab bars in the bathroom?: Yes Shower chair or bench in shower?: No Elevated toilet seat or a handicapped toilet?: No  TIMED UP AND GO:  Was the test performed?  No    Cognitive Function:        01/20/2023    8:09 AM 02/03/2022   10:41 AM  6CIT Screen  What Year? 0 points 0 points  What month? 0 points 0 points  What time? 0 points 0 points  Count back from 20 0 points 0 points  Months in reverse 0 points 0 points  Repeat phrase 0 points 0 points  Total Score 0 points 0 points    Immunizations Immunization History  Administered Date(s) Administered   Moderna Sars-Covid-2 Vaccination 06/16/2019, 07/14/2019     TDAP status: Due, Education has been provided regarding the importance of this vaccine. Advised may receive this vaccine at local pharmacy or Health Dept. Aware to provide a copy of the vaccination record if obtained from local pharmacy or Health Dept. Verbalized acceptance and understanding.  Flu Vaccine status: Declined, Education has been provided regarding the importance of this vaccine but patient still declined. Advised may receive this vaccine at local pharmacy or Health Dept. Aware to provide a copy of the vaccination record if obtained from local pharmacy or Health Dept. Verbalized acceptance and understanding.  Pneumococcal vaccine status: Declined,  Education has been provided regarding the importance of this vaccine but patient still declined. Advised may receive this vaccine at local pharmacy or Health Dept. Aware to provide a copy of the vaccination record if obtained from local pharmacy or Health Dept. Verbalized acceptance and  understanding.   Covid-19 vaccine status: Declined, Education has been provided regarding the importance of this vaccine but patient  still declined. Advised may receive this vaccine at local pharmacy or Health Dept.or vaccine clinic. Aware to provide a copy of the vaccination record if obtained from local pharmacy or Health Dept. Verbalized acceptance and understanding.  Qualifies for Shingles Vaccine? No   Zostavax completed No   Shingrix Completed?: No.    Education has been provided regarding the importance of this vaccine. Patient has been advised to call insurance company to determine out of pocket expense if they have not yet received this vaccine. Advised may also receive vaccine at local pharmacy or Health Dept. Verbalized acceptance and understanding.  Screening Tests Health Maintenance  Topic Date Due   Diabetic kidney evaluation - Urine ACR  Never done   OPHTHALMOLOGY EXAM  04/16/2022   INFLUENZA VACCINE  06/06/2023 (Originally 10/07/2022)   DTaP/Tdap/Td (1 - Tdap) 01/13/2024 (Originally 01/09/1975)   HEMOGLOBIN A1C  07/13/2023   Diabetic kidney evaluation - eGFR measurement  01/13/2024   FOOT EXAM  01/13/2024   Medicare Annual Wellness (AWV)  01/20/2024   Fecal DNA (Cologuard)  01/28/2024   HPV VACCINES  Aged Out   Pneumonia Vaccine 65+ Years old  Discontinued   COVID-19 Vaccine  Discontinued   Hepatitis C Screening  Discontinued   Zoster Vaccines- Shingrix  Discontinued    Health Maintenance  Health Maintenance Due  Topic Date Due   Diabetic kidney evaluation - Urine ACR  Never done   OPHTHALMOLOGY EXAM  04/16/2022    Colorectal cancer screening: Type of screening: Cologuard. Completed 20223. Repeat every 3 years  Lung Cancer Screening: (Low Dose CT Chest recommended if Age 109-80 years, 20 pack-year currently smoking OR have quit w/in 15years.) does not qualify.   Lung Cancer Screening Referral:   Additional Screening:  Hepatitis C Screening  never done  Vision Screening: Recommended annual ophthalmology exams for early detection of glaucoma and other disorders of the eye. Is the patient up to  date with their annual eye exam?  No  Who is the provider or what is the name of the office in which the patient attends annual eye exams? [facility] If pt is not established with a provider, would they like to be referred to a provider to establish care? No .   Dental Screening: Recommended annual dental exams for proper oral hygiene  Nutrition Risk Assessment:  Has the patient had any N/V/D within the last 2 months?  No  Does the patient have any non-healing wounds?  No  Has the patient had any unintentional weight loss or weight gain?  No   Diabetes:  Is the patient diabetic?  Yes  If diabetic, was a CBG obtained today?  No  Did the patient bring in their glucometer from home?  No  How often do you monitor your CBG's? Does not check.   Financial Strains and Diabetes Management:  Are you having any financial strains with the device, your supplies or your medication? No .  Does the patient want to be seen by Chronic Care Management for management of their diabetes?  No  Would the patient like to be referred to a Nutritionist or for Diabetic Management?  No   Diabetic Exams:  Diabetic Eye Exam: . Overdue for diabetic eye exam. Pt has been advised about the importance in completing this exam.  Diabetic Foot Exam:Pt has been advised about the importance in completing this exam.  Community  Resource Referral / Chronic Care Management: CRR required this visit?  No   CCM required this visit?  No     Plan:     I have personally reviewed and noted the following in the patient's chart:   Medical and social history Use of alcohol, tobacco or illicit drugs  Current medications and supplements including opioid prescriptions. Patient is not currently taking opioid prescriptions. Functional ability and status Nutritional status Physical activity Advanced directives List of other physicians Hospitalizations, surgeries, and ER visits in previous 12 months Vitals Screenings to include  cognitive, depression, and falls Referrals and appointments  In addition, I have reviewed and discussed with patient certain preventive protocols, quality metrics, and best practice recommendations. A written personalized care plan for preventive services as well as general preventive health recommendations were provided to patient.     Remi Haggard, LPN   [id number]/29/5621   After Visit Summary: (MyChart) Due to this being a telephonic visit, the after visit summary with patients personalized plan was offered to patient via MyChart   Nurse Notes:

## 2023-01-20 NOTE — Patient Instructions (Signed)
Mr. Maxwell Williams , Thank you for taking time to come for your Medicare Wellness Visit. I appreciate your ongoing commitment to your health goals. Please review the following plan we discussed and let me know if I can assist you in the future.   Screening recommendations/referrals: Colonoscopy: up to date Recommended yearly ophthalmology/optometry visit for glaucoma screening and checkup Recommended yearly dental visit for hygiene and checkup  Vaccinations: Influenza vaccine:  Pneumococcal vaccine:  Tdap vaccine:  Shingles vaccine:     Advanced directives:    Preventive Care 35 Years and Older, Male Preventive care refers to lifestyle choices and visits with your health care provider that can promote health and wellness. What does preventive care include? A yearly physical exam. This is also called an annual well check. Dental exams once or twice a year. Routine eye exams. Ask your health care provider how often you should have your eyes checked. Personal lifestyle choices, including: Daily care of your teeth and gums. Regular physical activity. Eating a healthy diet. Avoiding tobacco and drug use. Limiting alcohol use. Practicing safe sex. Taking low doses of aspirin every day. Taking vitamin and mineral supplements as recommended by your health care provider. What happens during an annual well check? The services and screenings done by your health care provider during your annual well check will depend on your age, overall health, lifestyle risk factors, and family history of disease. Counseling  Your health care provider may ask you questions about your: Alcohol use. Tobacco use. Drug use. Emotional well-being. Home and relationship well-being. Sexual activity. Eating habits. History of falls. Memory and ability to understand (cognition). Work and work Astronomer. Screening  You may have the following tests or measurements: Height, weight, and BMI. Blood  pressure. Lipid and cholesterol levels. These may be checked every 5 years, or more frequently if you are over 31 years old. Skin check. Lung cancer screening. You may have this screening every year starting at age 68 if you have a 30-pack-year history of smoking and currently smoke or have quit within the past 15 years. Fecal occult blood test (FOBT) of the stool. You may have this test every year starting at age 68. Flexible sigmoidoscopy or colonoscopy. You may have a sigmoidoscopy every 5 years or a colonoscopy every 10 years starting at age 68. Prostate cancer screening. Recommendations will vary depending on your family history and other risks. Hepatitis C blood test. Hepatitis B blood test. Sexually transmitted disease (STD) testing. Diabetes screening. This is done by checking your blood sugar (glucose) after you have not eaten for a while (fasting). You may have this done every 1-3 years. Abdominal aortic aneurysm (AAA) screening. You may need this if you are a current or former smoker. Osteoporosis. You may be screened starting at age 68 if you are at high risk. Talk with your health care provider about your test results, treatment options, and if necessary, the need for more tests. Vaccines  Your health care provider may recommend certain vaccines, such as: Influenza vaccine. This is recommended every year. Tetanus, diphtheria, and acellular pertussis (Tdap, Td) vaccine. You may need a Td booster every 10 years. Zoster vaccine. You may need this after age 60. Pneumococcal 13-valent conjugate (PCV13) vaccine. One dose is recommended after age 68. Pneumococcal polysaccharide (PPSV23) vaccine. One dose is recommended after age 68. Talk to your health care provider about which screenings and vaccines you need and how often you need them. This information is not intended to replace advice given to  you by your health care provider. Make sure you discuss any questions you have with your  health care provider. Document Released: 03/21/2015 Document Revised: 11/12/2015 Document Reviewed: 12/24/2014 Elsevier Interactive Patient Education  2017 [employer].  Fall Prevention in the Home Falls can cause injuries. They can happen to people of all ages. There are many things you can do to make your home safe and to help prevent falls. What can I do on the outside of my home? Regularly fix the edges of walkways and driveways and fix any cracks. Remove anything that might make you trip as you walk through a door, such as a raised step or threshold. Trim any bushes or trees on the path to your home. Use bright outdoor lighting. Clear any walking paths of anything that might make someone trip, such as rocks or tools. Regularly check to see if handrails are loose or broken. Make sure that both sides of any steps have handrails. Any raised decks and porches should have guardrails on the edges. Have any leaves, snow, or ice cleared regularly. Use sand or salt on walking paths during winter. Clean up any spills in your garage right away. This includes oil or grease spills. What can I do in the bathroom? Use night lights. Install grab bars by the toilet and in the tub and shower. Do not use towel bars as grab bars. Use non-skid mats or decals in the tub or shower. If you need to sit down in the shower, use a plastic, non-slip stool. Keep the floor dry. Clean up any water that spills on the floor as soon as it happens. Remove soap buildup in the tub or shower regularly. Attach bath mats securely with double-sided non-slip rug tape. Do not have throw rugs and other things on the floor that can make you trip. What can I do in the bedroom? Use night lights. Make sure that you have a light by your bed that is easy to reach. Do not use any sheets or blankets that are too big for your bed. They should not hang down onto the floor. Have a firm chair that has side arms. You can use this for  support while you get dressed. Do not have throw rugs and other things on the floor that can make you trip. What can I do in the kitchen? Clean up any spills right away. Avoid walking on wet floors. Keep items that you use a lot in easy-to-reach places. If you need to reach something above you, use a strong step stool that has a grab bar. Keep electrical cords out of the way. Do not use floor polish or wax that makes floors slippery. If you must use wax, use non-skid floor wax. Do not have throw rugs and other things on the floor that can make you trip. What can I do with my stairs? Do not leave any items on the stairs. Make sure that there are handrails on both sides of the stairs and use them. Fix handrails that are broken or loose. Make sure that handrails are as long as the stairways. Check any carpeting to make sure that it is firmly attached to the stairs. Fix any carpet that is loose or worn. Avoid having throw rugs at the top or bottom of the stairs. If you do have throw rugs, attach them to the floor with carpet tape. Make sure that you have a light switch at the top of the stairs and the bottom of the stairs.  If you do not have them, ask someone to add them for you. What else can I do to help prevent falls? Wear shoes that: Do not have high heels. Have rubber bottoms. Are comfortable and fit you well. Are closed at the toe. Do not wear sandals. If you use a stepladder: Make sure that it is fully opened. Do not climb a closed stepladder. Make sure that both sides of the stepladder are locked into place. Ask someone to hold it for you, if possible. Clearly Christien and make sure that you can see: Any grab bars or handrails. First and last steps. Where the edge of each step is. Use tools that help you move around (mobility aids) if they are needed. These include: Canes. Walkers. Scooters. Crutches. Turn on the lights when you go into a dark area. Replace any light bulbs as soon  as they burn out. Set up your furniture so you have a clear path. Avoid moving your furniture around. If any of your floors are uneven, fix them. If there are any pets around you, be aware of where they are. Review your medicines with your doctor. Some medicines can make you feel dizzy. This can increase your chance of falling. Ask your doctor what other things that you can do to help prevent falls. This information is not intended to replace advice given to you by your health care provider. Make sure you discuss any questions you have with your health care provider. Document Released: 12/19/2008 Document Revised: 07/31/2015 Document Reviewed: 03/29/2014 Elsevier Interactive Patient Education  2017 [employer].

## 2023-02-14 ENCOUNTER — Other Ambulatory Visit: Payer: Self-pay | Admitting: Physician Assistant

## 2023-02-14 DIAGNOSIS — F418 Other specified anxiety disorders: Secondary | ICD-10-CM

## 2023-02-14 DIAGNOSIS — E119 Type 2 diabetes mellitus without complications: Secondary | ICD-10-CM

## 2023-02-14 MED ORDER — SEMAGLUTIDE (1 MG/DOSE) 4 MG/3ML ~~LOC~~ SOPN: 1.0000 mg | PEN_INJECTOR | SUBCUTANEOUS | 1 refills | Status: DC

## 2023-02-16 ENCOUNTER — Telehealth: Payer: Self-pay

## 2023-02-16 NOTE — Telephone Encounter (Signed)
Patient stated yes he is in the donut hole, one pen is 270 dollars. They told him after the donut  hole it will go back to 45 dollars but he can't afford the 270 he is on fixed income.

## 2023-02-16 NOTE — Telephone Encounter (Signed)
Patient made aware.

## 2023-02-16 NOTE — Telephone Encounter (Unsigned)
Copied from CRM [id number]. Topic: Clinical - Medication Question >> Feb 16, 2023 11:17 AM Conni Elliot wrote: Reason for CRM: Pt called in stating when he went to pharmacy price for Ozempic rx was tripled, pharmacy informed pt that a limit was hit and the price will go back to normal at the beginning of the year, pt is calling to inquire about an alternative rx being sent to pharmacy, pt would like a callback

## 2023-02-16 NOTE — Telephone Encounter (Signed)
He can come by here and get 2 sample pens --- show him how to get 1mg  weekly dose when he picks it up from the sample pen

## 2023-02-16 NOTE — Telephone Encounter (Signed)
Does he mean that he is in donut hole for his insurance? - if so then he will be paying for any medication How much was the cost for one pen for him?

## 2023-02-23 ENCOUNTER — Other Ambulatory Visit: Payer: Self-pay | Admitting: Physician Assistant

## 2023-03-21 ENCOUNTER — Other Ambulatory Visit: Payer: Self-pay | Admitting: Physician Assistant

## 2023-04-08 ENCOUNTER — Other Ambulatory Visit: Payer: Self-pay | Admitting: Family Medicine

## 2023-04-08 DIAGNOSIS — I10 Essential (primary) hypertension: Secondary | ICD-10-CM

## 2023-04-13 ENCOUNTER — Other Ambulatory Visit: Payer: Self-pay | Admitting: Family Medicine

## 2023-04-13 DIAGNOSIS — I10 Essential (primary) hypertension: Secondary | ICD-10-CM

## 2023-05-19 ENCOUNTER — Ambulatory Visit: Payer: PPO | Admitting: Physician Assistant

## 2023-05-25 ENCOUNTER — Other Ambulatory Visit: Payer: Self-pay | Admitting: Physician Assistant

## 2023-05-30 ENCOUNTER — Other Ambulatory Visit: Payer: Self-pay

## 2023-05-31 ENCOUNTER — Ambulatory Visit (INDEPENDENT_AMBULATORY_CARE_PROVIDER_SITE_OTHER): Payer: PPO | Admitting: Physician Assistant

## 2023-05-31 ENCOUNTER — Encounter: Payer: Self-pay | Admitting: Physician Assistant

## 2023-05-31 VITALS — BP 110/70 | HR 72 | Temp 97.8°F | Resp 18 | Ht 68.5 in | Wt 216.0 lb

## 2023-05-31 DIAGNOSIS — E785 Hyperlipidemia, unspecified: Secondary | ICD-10-CM

## 2023-05-31 DIAGNOSIS — E1159 Type 2 diabetes mellitus with other circulatory complications: Secondary | ICD-10-CM

## 2023-05-31 DIAGNOSIS — E1169 Type 2 diabetes mellitus with other specified complication: Secondary | ICD-10-CM | POA: Diagnosis not present

## 2023-05-31 DIAGNOSIS — F418 Other specified anxiety disorders: Secondary | ICD-10-CM | POA: Diagnosis not present

## 2023-05-31 DIAGNOSIS — E119 Type 2 diabetes mellitus without complications: Secondary | ICD-10-CM

## 2023-05-31 DIAGNOSIS — I152 Hypertension secondary to endocrine disorders: Secondary | ICD-10-CM | POA: Diagnosis not present

## 2023-05-31 MED ORDER — LORAZEPAM 0.5 MG PO TABS: 0.5000 mg | ORAL_TABLET | Freq: Every day | ORAL | 1 refills | Status: DC | PRN

## 2023-05-31 NOTE — Progress Notes (Signed)
 Subjective:  Patient ID: Maxwell Williams, male    DOB: [birth year]  Age: 68 y.o. MRN: [id number]  Chief Complaint  Patient presents with   Diabetes   Medical Management of Chronic Issues    HPI Pt presents for follow up of hypertension.  The patient is tolerating the medication well without side effects. Compliance with treatment has been good; including taking medication as directed , maintains a healthy diet and regular exercise regimen , and following up as directed. Taking zestril 40mg  qd and hydrochlorothiazide 25mg  qd   Mixed hyperlipidemia  Pt presents with hyperlipidemia. Compliance with treatment has been good -The patient is compliant with medications, maintains a low cholesterol diet , follows up as directed , and maintains an exercise regimen . The patient denies experiencing any hypercholesterolemia related symptoms. Taking crestor 10mg  qd and fish oil  Pt with NIDDM - he is currently taking metformin - he actually stopped ozempic a few months ago stating "it did not do much for him and the cost was too much" - he states he has changed his eating habits - he does not check his glucose stating he does not know how but defers referral to dietician and does not want glucometer sent in for him to use at home Pt is due for eye exam  Pt with depression with anxiety -pt states he is wanting to wean off buspar and prozac - also feels these meds 'do nothing for him' - he occasionally uses his sisters xanax as needed and requests med to use only as needed  Pt with chronic knee pain from osteoarthritis - uses mobic 7.5mg  daily and alternates with voltaren gel       01/20/2023    8:12 AM 01/13/2023    8:55 AM 08/31/2022   10:34 AM 02/03/2022   10:37 AM 01/20/2022    8:18 AM  Depression screen PHQ 2/9  Decreased Interest 2 0 0 0 0  Down, Depressed, Hopeless 0 0 0 1 1  PHQ - 2 Score 2 0 0 1 1  Altered sleeping 0 0  0 0  Tired, decreased energy 0 0  0 0  Change in appetite 0 0  0 0   Feeling bad or failure about yourself  0 0  0 0  Trouble concentrating 0 0  0 0  Moving slowly or fidgety/restless 0 0  0 0  Suicidal thoughts 0 0  0 0  PHQ-9 Score 2 0  1 1  Difficult doing work/chores Not difficult at all Not difficult at all  Not difficult at all Not difficult at all        01/20/2022    8:18 AM 02/02/2022   10:57 AM 08/31/2022   10:34 AM 01/13/2023    8:55 AM 01/20/2023    8:04 AM  Fall Risk  Falls in the past year? 0 0 0 0 0  Was there an injury with Fall? 0 0 0 0 0  Fall Risk Category Calculator 0 0 0 0 0  Fall Risk Category (Retired) Low Low     (RETIRED) Patient Fall Risk Level Low fall risk Low fall risk     Patient at Risk for Falls Due to No Fall Risks No Fall Risks No Fall Risks No Fall Risks   Fall risk Follow up Falls evaluation completed Falls evaluation completed;Education provided Falls evaluation completed Falls evaluation completed Falls evaluation completed;Education provided;Falls prevention discussed     CONSTITUTIONAL: Negative for chills, fatigue, fever, unintentional weight  gain and unintentional weight loss.  E/N/T: Negative for ear pain, nasal congestion and sore throat.  CARDIOVASCULAR: Negative for chest pain, dizziness, palpitations and pedal edema.  RESPIRATORY: Negative for recent cough and dyspnea.  GASTROINTESTINAL: Negative for abdominal pain, acid reflux symptoms, constipation, diarrhea, nausea and vomiting.  MSK: Negative for arthralgias and myalgias.  INTEGUMENTARY: Negative for rash.  NEUROLOGICAL: Negative for dizziness and headaches.  PSYCHIATRIC: Negative for sleep disturbance and to question depression screen.  Negative for depression, negative for anhedonia.        Current Outpatient Medications:    busPIRone (BUSPAR) 10 MG tablet, TAKE 1 TABLET(10 MG) BY MOUTH TWICE DAILY, Disp: 180 tablet, Rfl: 0   diclofenac Sodium (VOLTAREN) 1 % GEL, Apply to affected areas bid as needed, Disp: 100 g, Rfl: 2   FLUoxetine (PROZAC)  10 MG capsule, TAKE 1 CAPSULE(10 MG) BY MOUTH DAILY, Disp: 30 capsule, Rfl: 3   hydrochlorothiazide (HYDRODIURIL) 25 MG tablet, TAKE 1 TABLET(25 MG) BY MOUTH DAILY, Disp: 90 tablet, Rfl: 0   lisinopril (ZESTRIL) 40 MG tablet, TAKE 1 TABLET(40 MG) BY MOUTH DAILY, Disp: 90 tablet, Rfl: 1   LORazepam (ATIVAN) 0.5 MG tablet, Take 1 tablet (0.5 mg total) by mouth daily as needed for anxiety., Disp: 30 tablet, Rfl: 1   meloxicam (MOBIC) 7.5 MG tablet, TAKE 1 TABLET(7.5 MG) BY MOUTH DAILY, Disp: 90 tablet, Rfl: 0   metFORMIN (GLUCOPHAGE) 500 MG tablet, TAKE 1 TABLET(500 MG) BY MOUTH TWICE DAILY WITH A MEAL, Disp: 180 tablet, Rfl: 1   Omega-3 Fatty Acids (FISH OIL) 1000 MG CAPS, Take by mouth., Disp: , Rfl:    rosuvastatin (CRESTOR) 10 MG tablet, TAKE 1 TABLET(10 MG) BY MOUTH DAILY, Disp: 90 tablet, Rfl: 1  Past Medical History:  Diagnosis Date   Bilateral carpal tunnel syndrome 09/22/2015   Hypertension    Mixed hyperlipidemia 09/22/2015   Unilateral primary osteoarthritis, left knee 07/31/2019   Unilateral primary osteoarthritis, right knee 07/24/2020   Objective:  PHYSICAL EXAM:   VS: BP 110/70   Pulse 72   Temp 97.8 F (36.6 C)   Resp 18   Ht 5' 8.5" (1.74 m)   Wt 216 lb (98 kg)   SpO2 100%   BMI 32.36 kg/m   GEN: Well nourished, well developed, in no acute distress   Cardiac: RRR; no murmurs, rubs, or gallops,no edema -  Respiratory:  normal respiratory rate and pattern with no distress - normal breath sounds with no rales, rhonchi, wheezes or rubs  MS: no deformity or atrophy  Skin: warm and dry, no rash  Neuro:  Alert and Oriented x 3,- CN II-Xii grossly intact Psych: euthymic mood, appropriate affect and demeanor   Assessment & Plan:    Non-insulin dependent type 2 diabetes mellitus (HCC) -     CBC with Differential/Platelet -     Comprehensive metabolic panel -     Hemoglobin A1c -     Lipid panel -    watch diet Urine microalbumin ordered  Depression with  anxiety Taper off buspar and prozac Rx ativan to use prn Hypertension associated with diabetes (HCC) Continue meds Labwork pending Hyperlipidemia associated with type 2 diabetes mellitus (HCC) Continue meds Watch diet Labwork pending    Follow-up: Return in about 6 months (around 12/01/2023) for chronic fasting follow-up.  An After Visit Summary was printed and given to the patient.  Jettie Pagan [facility] [phone]

## 2023-06-02 LAB — MICROALBUMIN / CREATININE URINE RATIO
Creatinine, Urine: 177.4 mg/dL
Microalb/Creat Ratio: 40 mg/g{creat} — ABNORMAL HIGH (ref 0–29)
Microalbumin, Urine: 70.2 ug/mL

## 2023-06-02 LAB — CBC WITH DIFFERENTIAL/PLATELET
Basophils Absolute: 0.1 10*3/uL (ref 0.0–0.2)
Basos: 1 %
EOS (ABSOLUTE): 0.4 10*3/uL (ref 0.0–0.4)
Eos: 6 %
Hematocrit: 43.8 % (ref 37.5–51.0)
Hemoglobin: 14.6 g/dL (ref 13.0–17.7)
Immature Grans (Abs): 0 10*3/uL (ref 0.0–0.1)
Immature Granulocytes: 0 %
Lymphocytes Absolute: 1.2 10*3/uL (ref 0.7–3.1)
Lymphs: 19 %
MCH: 31 pg (ref 26.6–33.0)
MCHC: 33.3 g/dL (ref 31.5–35.7)
MCV: 93 fL (ref 79–97)
Monocytes Absolute: 0.4 10*3/uL (ref 0.1–0.9)
Monocytes: 7 %
Neutrophils Absolute: 4.4 10*3/uL (ref 1.4–7.0)
Neutrophils: 67 %
Platelets: 244 10*3/uL (ref 150–450)
RBC: 4.71 x10E6/uL (ref 4.14–5.80)
RDW: 11.9 % (ref 11.6–15.4)
WBC: 6.5 10*3/uL (ref 3.4–10.8)

## 2023-06-02 LAB — COMPREHENSIVE METABOLIC PANEL WITH GFR
ALT: 23 IU/L (ref 0–44)
AST: 21 IU/L (ref 0–40)
Albumin: 4.8 g/dL (ref 3.9–4.9)
Alkaline Phosphatase: 61 IU/L (ref 44–121)
BUN/Creatinine Ratio: 12 (ref 10–24)
BUN: 10 mg/dL (ref 8–27)
Bilirubin Total: 0.6 mg/dL (ref 0.0–1.2)
CO2: 24 mmol/L (ref 20–29)
Calcium: 9.8 mg/dL (ref 8.6–10.2)
Chloride: 98 mmol/L (ref 96–106)
Creatinine, Ser: 0.83 mg/dL (ref 0.76–1.27)
Globulin, Total: 2.1 g/dL (ref 1.5–4.5)
Glucose: 142 mg/dL — ABNORMAL HIGH (ref 70–99)
Potassium: 4.3 mmol/L (ref 3.5–5.2)
Sodium: 139 mmol/L (ref 134–144)
Total Protein: 6.9 g/dL (ref 6.0–8.5)
eGFR: 95 mL/min/{1.73_m2} (ref >59)

## 2023-06-02 LAB — HEMOGLOBIN A1C
Est. average glucose Bld gHb Est-mCnc: 137 mg/dL
Hgb A1c MFr Bld: 6.4 % — ABNORMAL HIGH (ref 4.8–5.6)

## 2023-06-02 LAB — LIPID PANEL
Chol/HDL Ratio: 3.1 ratio (ref 0.0–5.0)
Cholesterol, Total: 132 mg/dL (ref 100–199)
HDL: 43 mg/dL (ref >39)
LDL Chol Calc (NIH): 58 mg/dL (ref 0–99)
Triglycerides: 184 mg/dL — ABNORMAL HIGH (ref 0–149)
VLDL Cholesterol Cal: 31 mg/dL (ref 5–40)

## 2023-07-06 ENCOUNTER — Other Ambulatory Visit: Payer: Self-pay | Admitting: Family Medicine

## 2023-07-06 ENCOUNTER — Other Ambulatory Visit: Payer: Self-pay | Admitting: Physician Assistant

## 2023-07-08 ENCOUNTER — Other Ambulatory Visit: Payer: Self-pay | Admitting: Family Medicine

## 2023-07-08 DIAGNOSIS — E782 Mixed hyperlipidemia: Secondary | ICD-10-CM

## 2023-08-23 ENCOUNTER — Other Ambulatory Visit: Payer: Self-pay | Admitting: Physician Assistant

## 2023-08-23 DIAGNOSIS — F418 Other specified anxiety disorders: Secondary | ICD-10-CM

## 2023-08-25 ENCOUNTER — Encounter: Payer: Self-pay | Admitting: Physician Assistant

## 2023-08-25 ENCOUNTER — Ambulatory Visit: Admitting: Physician Assistant

## 2023-08-25 DIAGNOSIS — M1711 Unilateral primary osteoarthritis, right knee: Secondary | ICD-10-CM | POA: Diagnosis not present

## 2023-08-25 MED ORDER — METHYLPREDNISOLONE ACETATE 40 MG/ML IJ SUSP
40.0000 mg | INTRAMUSCULAR | Status: AC | PRN
  Administered 2023-08-25: 40 mg via INTRA_ARTICULAR

## 2023-08-25 MED ORDER — LIDOCAINE HCL 1 % IJ SOLN
4.0000 mL | INTRAMUSCULAR | Status: AC | PRN
  Administered 2023-08-25: 4 mL
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
## 2023-08-27 ENCOUNTER — Other Ambulatory Visit: Payer: Self-pay | Admitting: Physician Assistant

## 2023-09-08 ENCOUNTER — Encounter: Payer: Self-pay | Admitting: Physician Assistant

## 2023-09-08 ENCOUNTER — Other Ambulatory Visit (INDEPENDENT_AMBULATORY_CARE_PROVIDER_SITE_OTHER): Payer: Self-pay

## 2023-09-08 ENCOUNTER — Ambulatory Visit: Admitting: Physician Assistant

## 2023-09-08 ENCOUNTER — Telehealth: Payer: Self-pay

## 2023-09-08 DIAGNOSIS — G8929 Other chronic pain: Secondary | ICD-10-CM

## 2023-09-08 DIAGNOSIS — M1711 Unilateral primary osteoarthritis, right knee: Secondary | ICD-10-CM | POA: Diagnosis not present

## 2023-09-08 DIAGNOSIS — M25562 Pain in left knee: Secondary | ICD-10-CM

## 2023-09-08 DIAGNOSIS — M25561 Pain in right knee: Secondary | ICD-10-CM

## 2023-09-08 DIAGNOSIS — M1712 Unilateral primary osteoarthritis, left knee: Secondary | ICD-10-CM
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
## 2023-09-13 ENCOUNTER — Telehealth: Payer: Self-pay | Admitting: Physician Assistant

## 2023-09-13 NOTE — Telephone Encounter (Signed)
 Patient called. Would like the status of the gel injections.

## 2023-09-14 NOTE — Telephone Encounter (Signed)
Talked with patient and appt.has been scheduled for gel injection.  

## 2023-09-14 NOTE — Telephone Encounter (Signed)
 VOB submitted for Monovisc, bilateral knee

## 2023-09-20 ENCOUNTER — Ambulatory Visit: Admitting: Physician Assistant

## 2023-09-20 ENCOUNTER — Other Ambulatory Visit: Payer: Self-pay

## 2023-09-20 ENCOUNTER — Encounter: Payer: Self-pay | Admitting: Physician Assistant

## 2023-09-20 DIAGNOSIS — M170 Bilateral primary osteoarthritis of knee: Secondary | ICD-10-CM

## 2023-09-20 DIAGNOSIS — M1711 Unilateral primary osteoarthritis, right knee: Secondary | ICD-10-CM

## 2023-09-20 DIAGNOSIS — M1712 Unilateral primary osteoarthritis, left knee: Secondary | ICD-10-CM

## 2023-09-20 MED ORDER — HYALURONAN 88 MG/4ML IX SOSY
88.0000 mg | PREFILLED_SYRINGE | INTRA_ARTICULAR | Status: AC | PRN
  Administered 2023-09-20: 88 mg via INTRA_ARTICULAR
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
## 2023-10-12 ENCOUNTER — Other Ambulatory Visit: Payer: Self-pay | Admitting: Physician Assistant

## 2023-10-12 DIAGNOSIS — F418 Other specified anxiety disorders: Secondary | ICD-10-CM

## 2023-10-21 ENCOUNTER — Other Ambulatory Visit: Payer: Self-pay | Admitting: Physician Assistant

## 2023-10-21 DIAGNOSIS — I10 Essential (primary) hypertension: Secondary | ICD-10-CM

## 2023-10-25 NOTE — Progress Notes (Signed)
   10/25/2023  Patient ID: Maxwell Williams, male   DOB: [birth year], 68 y.o.   MRN: [id number]  Pharmacy Quality Measure Review  This patient is appearing on a report for being at risk of failing the adherence measure for diabetes medications this calendar year.   Medication: metformin  Last fill date: 08/24/2023  for 90 day supply  Insurance report was not up to date. No action needed at this time.   Lang Sieve, PharmD, BCGP Clinical Pharmacist  [phone]

## 2023-12-01 ENCOUNTER — Ambulatory Visit: Admitting: Physician Assistant

## 2023-12-06 ENCOUNTER — Ambulatory Visit (INDEPENDENT_AMBULATORY_CARE_PROVIDER_SITE_OTHER): Admitting: Physician Assistant

## 2023-12-06 ENCOUNTER — Encounter: Payer: Self-pay | Admitting: Physician Assistant

## 2023-12-06 VITALS — BP 130/80 | HR 70 | Temp 97.8°F | Resp 18 | Ht 68.5 in | Wt 215.6 lb

## 2023-12-06 DIAGNOSIS — M0580 Other rheumatoid arthritis with rheumatoid factor of unspecified site: Secondary | ICD-10-CM

## 2023-12-06 DIAGNOSIS — M25561 Pain in right knee: Secondary | ICD-10-CM | POA: Diagnosis not present

## 2023-12-06 DIAGNOSIS — M25562 Pain in left knee: Secondary | ICD-10-CM

## 2023-12-06 DIAGNOSIS — E785 Hyperlipidemia, unspecified: Secondary | ICD-10-CM

## 2023-12-06 DIAGNOSIS — F418 Other specified anxiety disorders: Secondary | ICD-10-CM | POA: Diagnosis not present

## 2023-12-06 DIAGNOSIS — E119 Type 2 diabetes mellitus without complications: Secondary | ICD-10-CM

## 2023-12-06 DIAGNOSIS — G8929 Other chronic pain: Secondary | ICD-10-CM | POA: Diagnosis not present

## 2023-12-06 DIAGNOSIS — Z125 Encounter for screening for malignant neoplasm of prostate: Secondary | ICD-10-CM

## 2023-12-06 DIAGNOSIS — E1169 Type 2 diabetes mellitus with other specified complication: Secondary | ICD-10-CM

## 2023-12-06 DIAGNOSIS — E1159 Type 2 diabetes mellitus with other circulatory complications: Secondary | ICD-10-CM | POA: Diagnosis not present

## 2023-12-06 DIAGNOSIS — I152 Hypertension secondary to endocrine disorders: Secondary | ICD-10-CM | POA: Diagnosis not present

## 2023-12-06 MED ORDER — CLONAZEPAM 0.5 MG PO TABS: ORAL_TABLET | ORAL | 0 refills | Status: AC

## 2023-12-06 NOTE — Progress Notes (Signed)
 Subjective:  Patient ID: Maxwell Williams, male    DOB: 09/06/1955  Age: 68 y.o. MRN: [id number]  Chief Complaint  Patient presents with   Medical Management of Chronic Issues    HPI Pt presents for follow up of hypertension.  The patient is tolerating the medication well without side effects. Compliance with treatment has been good; including taking medication as directed , maintains a healthy diet and regular exercise regimen , and following up as directed. Taking zestril  40mg  qd and hydrochlorothiazide  25mg  qd  Denies chest pain or dyspnea.  No peripheral edema  Mixed hyperlipidemia  Pt presents with hyperlipidemia. Compliance with treatment has been good -The patient is compliant with medications, maintains a low cholesterol diet , follows up as directed , and maintains an exercise regimen . The patient denies experiencing any hypercholesterolemia related symptoms. Taking crestor  10mg  qd and fish oil  Pt with NIDDM - he is currently taking metformin  -he states he has changed his eating habits - he does not check his glucose  Pt is due for eye exam  Pt with depression with anxiety -pt is prescribed ativan  which works minimally for him and would like to try another medication instead  Pt with chronic knee pain from osteoarthritis - uses mobic  7.5mg  daily and alternates with voltaren  gel - not ready for referral to ortho He did have positive rheumatoid factor a few years ago and had seen Maxwell Williams but defers to follow up with him as well at this time      12/06/2023    9:49 AM 01/20/2023    8:12 AM 01/13/2023    8:55 AM 08/31/2022   10:34 AM 02/03/2022   10:37 AM  Depression screen PHQ 2/9  Decreased Interest 0 2 0 0 0  Down, Depressed, Hopeless 0 0 0 0 1  PHQ - 2 Score 0 2 0 0 1  Altered sleeping 0 0 0  0  Tired, decreased energy 0 0 0  0  Change in appetite 0 0 0  0  Feeling bad or failure about yourself  0 0 0  0  Trouble concentrating 0 0 0  0  Moving slowly or  fidgety/restless 0 0 0  0  Suicidal thoughts 0 0 0  0  PHQ-9 Score 0 2 0  1  Difficult doing work/chores Not difficult at all Not difficult at all Not difficult at all  Not difficult at all        02/02/2022   10:57 AM 08/31/2022   10:34 AM 01/13/2023    8:55 AM 01/20/2023    8:04 AM 12/06/2023    9:49 AM  Fall Risk  Falls in the past year? 0 0 0 0 0  Was there an injury with Fall? 0 0 0 0 0  Fall Risk Category Calculator 0 0 0 0 0  Fall Risk Category (Retired) Low       (RETIRED) Patient Fall Risk Level Low fall risk       Patient at Risk for Falls Due to No Fall Risks No Fall Risks No Fall Risks  No Fall Risks  Fall risk Follow up Falls evaluation completed;Education provided  Falls evaluation completed Falls evaluation completed Falls evaluation completed;Education provided;Falls prevention discussed Falls evaluation completed     Data saved with a previous flowsheet row definition     CONSTITUTIONAL: Negative for chills, fatigue, fever, unintentional weight gain and unintentional weight loss.  E/N/T: Negative for ear pain, nasal congestion and sore  throat.  CARDIOVASCULAR: Negative for chest pain, dizziness, palpitations and pedal edema.  RESPIRATORY: Negative for recent cough and dyspnea.  GASTROINTESTINAL: Negative for abdominal pain, acid reflux symptoms, constipation, diarrhea, nausea and vomiting.  MSK: see HPI INTEGUMENTARY: Negative for rash.  NEUROLOGICAL: Negative for dizziness and headaches.  PSYCHIATRIC: Negative for sleep disturbance and to question depression screen.  Negative for depression, negative for anhedonia.        Current Outpatient Medications:    clonazePAM (KLONOPIN) 0.5 MG tablet, 1 po qd prn anxiety, Disp: 30 tablet, Rfl: 0   diclofenac  Sodium (VOLTAREN ) 1 % GEL, Apply to affected areas bid as needed, Disp: 100 g, Rfl: 2   hydrochlorothiazide  (HYDRODIURIL ) 25 MG tablet, TAKE 1 TABLET(25 MG) BY MOUTH DAILY, Disp: 90 tablet, Rfl: 0   lisinopril   (ZESTRIL ) 40 MG tablet, TAKE 1 TABLET(40 MG) BY MOUTH DAILY, Disp: 90 tablet, Rfl: 1   meloxicam  (MOBIC ) 7.5 MG tablet, TAKE 1 TABLET(7.5 MG) BY MOUTH DAILY, Disp: 90 tablet, Rfl: 1   metFORMIN  (GLUCOPHAGE ) 500 MG tablet, TAKE 1 TABLET(500 MG) BY MOUTH TWICE DAILY WITH A MEAL, Disp: 180 tablet, Rfl: 1   Omega-3 Fatty Acids (FISH OIL) 1000 MG CAPS, Take by mouth., Disp: , Rfl:    rosuvastatin  (CRESTOR ) 10 MG tablet, TAKE 1 TABLET(10 MG) BY MOUTH DAILY, Disp: 90 tablet, Rfl: 1  Past Medical History:  Diagnosis Date   Bilateral carpal tunnel syndrome 09/22/2015   Hypertension    Mixed hyperlipidemia 09/22/2015   Unilateral primary osteoarthritis, left knee 07/31/2019   Unilateral primary osteoarthritis, right knee 07/24/2020   Objective:  PHYSICAL EXAM:   VS: BP 130/80   Pulse 70   Temp 97.8 Williams (36.6 C) (Temporal)   Resp 18   Ht 5' 8.5 (1.74 m)   Wt 215 lb 9.6 oz (97.8 kg)   SpO2 98%   BMI 32.31 kg/m   GEN: Well nourished, well developed, in no acute distress  Cardiac: RRR; no murmurs, rubs, or gallops,no edema -  Respiratory:  normal respiratory rate and pattern with no distress - normal breath sounds with no rales, rhonchi, wheezes or rubs  MS: no deformity or atrophy  Skin: warm and dry, no rash  Neuro:  Alert and Oriented x 3, - CN II-Xii grossly intact Psych: euthymic mood, appropriate affect and demeanor   Assessment & Plan:    Non-insulin dependent type 2 diabetes mellitus (HCC) -     CBC with Differential/Platelet -     Comprehensive metabolic panel -     Hemoglobin A1c -     Lipid panel -    watch diet   Depression with anxiety Stop ativan  Rx klonopin Hypertension associated with diabetes (HCC) Continue meds Labwork pending Hyperlipidemia associated with type 2 diabetes mellitus (HCC) Continue meds Watch diet Labwork pending  Chronic knee pain Polyarthritis with positive rheumatoid factor Continue meds - pt will advise when he is ready for ortho or  rheumatoid follow up    Prostate cancer screening PSA pending  Follow-up: Return in about 6 months (around 06/04/2024) for chronic fasting follow-up.  An After Visit Summary was printed and given to the patient.  Maxwell JONELLE NICHOLAUS DEVONNA [facility] [phone]

## 2023-12-06 NOTE — Addendum Note (Signed)
 Addended by: NICHOLAUS CREDIT on: 12/06/2023 12:18 PM   Modules accepted: Level of Service

## 2023-12-07 LAB — LIPID PANEL
Chol/HDL Ratio: 3 ratio (ref 0.0–5.0)
Cholesterol, Total: 124 mg/dL (ref 100–199)
HDL: 41 mg/dL (ref >39)
LDL Chol Calc (NIH): 50 mg/dL (ref 0–99)
Triglycerides: 203 mg/dL — ABNORMAL HIGH (ref 0–149)
VLDL Cholesterol Cal: 33 mg/dL (ref 5–40)

## 2023-12-07 LAB — CBC WITH DIFFERENTIAL/PLATELET
Basophils Absolute: 0.1 x10E3/uL (ref 0.0–0.2)
Basos: 1 %
EOS (ABSOLUTE): 0.4 x10E3/uL (ref 0.0–0.4)
Eos: 7 %
Hematocrit: 41.2 % (ref 37.5–51.0)
Hemoglobin: 13.5 g/dL (ref 13.0–17.7)
Immature Grans (Abs): 0 x10E3/uL (ref 0.0–0.1)
Immature Granulocytes: 0 %
Lymphocytes Absolute: 1.5 x10E3/uL (ref 0.7–3.1)
Lymphs: 23 %
MCH: 30.5 pg (ref 26.6–33.0)
MCHC: 32.8 g/dL (ref 31.5–35.7)
MCV: 93 fL (ref 79–97)
Monocytes Absolute: 0.4 x10E3/uL (ref 0.1–0.9)
Monocytes: 7 %
Neutrophils Absolute: 4 x10E3/uL (ref 1.4–7.0)
Neutrophils: 62 %
Platelets: 218 x10E3/uL (ref 150–450)
RBC: 4.43 x10E6/uL (ref 4.14–5.80)
RDW: 12 % (ref 11.6–15.4)
WBC: 6.3 x10E3/uL (ref 3.4–10.8)

## 2023-12-07 LAB — COMPREHENSIVE METABOLIC PANEL WITH GFR
ALT: 28 IU/L (ref 0–44)
AST: 18 IU/L (ref 0–40)
Albumin: 4.4 g/dL (ref 3.9–4.9)
Alkaline Phosphatase: 59 IU/L (ref 47–123)
BUN/Creatinine Ratio: 24 (ref 10–24)
BUN: 19 mg/dL (ref 8–27)
Bilirubin Total: 0.5 mg/dL (ref 0.0–1.2)
CO2: 22 mmol/L (ref 20–29)
Calcium: 9.5 mg/dL (ref 8.6–10.2)
Chloride: 104 mmol/L (ref 96–106)
Creatinine, Ser: 0.8 mg/dL (ref 0.76–1.27)
Globulin, Total: 2.2 g/dL (ref 1.5–4.5)
Glucose: 128 mg/dL — ABNORMAL HIGH (ref 70–99)
Potassium: 4.8 mmol/L (ref 3.5–5.2)
Sodium: 142 mmol/L (ref 134–144)
Total Protein: 6.6 g/dL (ref 6.0–8.5)
eGFR: 96 mL/min/1.73 (ref >59)

## 2023-12-07 LAB — HEMOGLOBIN A1C
Est. average glucose Bld gHb Est-mCnc: 128 mg/dL
Hgb A1c MFr Bld: 6.1 % — ABNORMAL HIGH (ref 4.8–5.6)

## 2023-12-07 LAB — TSH: TSH: 1.26 u[IU]/mL (ref 0.450–4.500)

## 2023-12-07 LAB — PSA: Prostate Specific Ag, Serum: 3.8 ng/mL (ref 0.0–4.0)

## 2023-12-08 ENCOUNTER — Ambulatory Visit: Payer: Self-pay | Admitting: Family Medicine

## 2024-01-09 ENCOUNTER — Encounter: Payer: Self-pay | Admitting: Radiology

## 2024-01-17 ENCOUNTER — Other Ambulatory Visit: Payer: Self-pay | Admitting: Physician Assistant

## 2024-01-17 DIAGNOSIS — I10 Essential (primary) hypertension: Secondary | ICD-10-CM

## 2024-01-17 DIAGNOSIS — E782 Mixed hyperlipidemia: Secondary | ICD-10-CM

## 2024-01-18 ENCOUNTER — Other Ambulatory Visit: Payer: Self-pay | Admitting: Physician Assistant

## 2024-01-20 ENCOUNTER — Other Ambulatory Visit: Payer: Self-pay | Admitting: Physician Assistant

## 2024-01-20 DIAGNOSIS — I10 Essential (primary) hypertension: Secondary | ICD-10-CM

## 2024-04-10 ENCOUNTER — Ambulatory Visit: Payer: Self-pay

## 2024-04-10 ENCOUNTER — Other Ambulatory Visit: Payer: Self-pay | Admitting: Physician Assistant

## 2024-04-10 VITALS — BP 130/80 | Ht 68.5 in | Wt 205.0 lb

## 2024-04-10 DIAGNOSIS — F418 Other specified anxiety disorders: Secondary | ICD-10-CM

## 2024-04-10 DIAGNOSIS — Z0000 Encounter for general adult medical examination without abnormal findings: Secondary | ICD-10-CM

## 2024-04-10 NOTE — Addendum Note (Signed)
 Addended by: Khris Jansson K on: 04/10/2024 04:15 PM   Modules accepted: Level of Service

## 2024-04-10 NOTE — Patient Instructions (Signed)
 Mr. Polak,  Thank you for taking the time for your Medicare Wellness Visit. I appreciate your continued commitment to your health goals. Please review the care plan we discussed, and feel free to reach out if I can assist you further.  Please note that Annual Wellness Visits do not include a physical exam. Some assessments may be limited, especially if the visit was conducted virtually. If needed, we may recommend an in-person follow-up with your provider.  Ongoing Care Seeing your primary care provider every 3 to 6 months helps us  monitor your health and provide consistent, personalized care.   Referrals If a referral was made during today's visit and you haven't received any updates within two weeks, please contact the referred provider directly to check on the status.  Recommended Screenings:  Health Maintenance  Topic Date Due   DTaP/Tdap/Td vaccine (1 - Tdap) Never done   Eye exam for diabetics  04/16/2022   Complete foot exam   01/13/2024   Medicare Annual Wellness Visit  01/20/2024   Cologuard (Stool DNA test)  01/28/2024   Flu Shot  06/05/2024*   Kidney health urinalysis for diabetes  05/30/2024   Hemoglobin A1C  06/04/2024   Yearly kidney function blood test for diabetes  12/05/2024   Meningitis B Vaccine  Aged Out   Pneumococcal Vaccine for age over 59  Discontinued   COVID-19 Vaccine  Discontinued   Hepatitis C Screening  Discontinued   Zoster (Shingles) Vaccine  Discontinued  *Topic was postponed. The date shown is not the original due date.       04/10/2024   10:35 AM  Advanced Directives  Does Patient Have a Medical Advance Directive? No  Would patient like information on creating a medical advance directive? No - Patient declined    Vision: Annual vision screenings are recommended for early detection of glaucoma, cataracts, and diabetic retinopathy. These exams can also reveal signs of chronic conditions such as diabetes and high blood pressure.  Dental:  Annual dental screenings help detect early signs of oral cancer, gum disease, and other conditions linked to overall health, including heart disease and diabetes.  Please see the attached documents for additional preventive care recommendations.

## 2024-06-08 ENCOUNTER — Ambulatory Visit: Admitting: Physician Assistant
